# Patient Record
Sex: Female | Born: 1955 | Hispanic: No | Marital: Single | Smoking: Former smoker
Health system: Southern US, Community
[De-identification: ages and names within clinical notes are randomized; demographics above are authoritative.]

## PROBLEM LIST (undated history)

## (undated) DIAGNOSIS — M1990 Unspecified osteoarthritis, unspecified site: Secondary | ICD-10-CM

## (undated) DIAGNOSIS — E785 Hyperlipidemia, unspecified: Secondary | ICD-10-CM

## (undated) HISTORY — PX: TUBAL LIGATION: SHX77

## (undated) HISTORY — DX: Hyperlipidemia, unspecified: E78.5

## (undated) HISTORY — PX: INNER EAR SURGERY: SHX679

## (undated) HISTORY — DX: Unspecified osteoarthritis, unspecified site: M19.90

---

## 2004-12-21 ENCOUNTER — Ambulatory Visit: Payer: Self-pay | Admitting: Family Medicine

## 2005-02-14 ENCOUNTER — Ambulatory Visit: Payer: Self-pay | Admitting: Family Medicine

## 2016-05-09 ENCOUNTER — Ambulatory Visit (INDEPENDENT_AMBULATORY_CARE_PROVIDER_SITE_OTHER): Payer: BLUE CROSS/BLUE SHIELD | Admitting: Family Medicine

## 2016-05-09 ENCOUNTER — Encounter: Payer: Self-pay | Admitting: Family Medicine

## 2016-05-09 VITALS — BP 142/94 | HR 73 | Temp 97.9°F | Ht 64.8 in | Wt 170.5 lb

## 2016-05-09 DIAGNOSIS — Z0000 Encounter for general adult medical examination without abnormal findings: Secondary | ICD-10-CM

## 2016-05-09 DIAGNOSIS — Z1239 Encounter for other screening for malignant neoplasm of breast: Secondary | ICD-10-CM

## 2016-05-09 DIAGNOSIS — Z130 Encounter for screening for diseases of the blood and blood-forming organs and certain disorders involving the immune mechanism: Secondary | ICD-10-CM

## 2016-05-09 DIAGNOSIS — Z13220 Encounter for screening for lipoid disorders: Secondary | ICD-10-CM | POA: Diagnosis not present

## 2016-05-09 DIAGNOSIS — E663 Overweight: Secondary | ICD-10-CM

## 2016-05-09 LAB — CBC
HCT: 40.9 % (ref 36.0–46.0)
Hemoglobin: 13.6 g/dL (ref 12.0–15.0)
MCHC: 33.3 g/dL (ref 30.0–36.0)
MCV: 83.7 fl (ref 78.0–100.0)
Platelets: 279 10*3/uL (ref 150.0–400.0)
RBC: 4.88 Mil/uL (ref 3.87–5.11)
RDW: 13.2 % (ref 11.5–15.5)
WBC: 6.9 10*3/uL (ref 4.0–10.5)

## 2016-05-09 LAB — COMPREHENSIVE METABOLIC PANEL
ALT: 18 U/L (ref 0–35)
AST: 19 U/L (ref 0–37)
Albumin: 4.6 g/dL (ref 3.5–5.2)
Alkaline Phosphatase: 75 U/L (ref 39–117)
BUN: 10 mg/dL (ref 6–23)
CO2: 29 mEq/L (ref 19–32)
Calcium: 9.9 mg/dL (ref 8.4–10.5)
Chloride: 104 mEq/L (ref 96–112)
Creatinine, Ser: 0.81 mg/dL (ref 0.40–1.20)
GFR: 76.24 mL/min (ref >60.00)
Glucose, Bld: 94 mg/dL (ref 70–99)
Potassium: 4.1 mEq/L (ref 3.5–5.1)
Sodium: 141 mEq/L (ref 135–145)
Total Bilirubin: 0.7 mg/dL (ref 0.2–1.2)
Total Protein: 7.2 g/dL (ref 6.0–8.3)

## 2016-05-09 LAB — LIPID PANEL
Cholesterol: 231 mg/dL — ABNORMAL HIGH (ref 0–200)
HDL: 51.4 mg/dL (ref >39.00)
LDL Cholesterol: 144 mg/dL — ABNORMAL HIGH (ref 0–99)
NonHDL: 179.97
Total CHOL/HDL Ratio: 5
Triglycerides: 178 mg/dL — ABNORMAL HIGH (ref 0.0–149.0)
VLDL: 35.6 mg/dL (ref 0.0–40.0)

## 2016-05-09 LAB — HEMOGLOBIN A1C: Hgb A1c MFr Bld: 5.7 % (ref 4.6–6.5)

## 2016-05-09 NOTE — Progress Notes (Signed)
Subjective:  Patient ID: Erika Gonzalez, female    DOB: 03-02-1956  Age: 60 y.o. MRN: [id number]  CC: Establish care   HPI Erika Gonzalez is a 60 y.o. female presents to the clinic today to establish care. Patient has no concerns today.  Preventative Healthcare  Pap smear: In need of. Last pap was in 2006.  Mammogram: In need of. Will schedule.  Colonoscopy: Declines. Discussed cologuard.  Immunizations  Tetanus - In need of but declines.   Pneumococcal - 2017.  Zoster - Declines.  Hepatitis C screening - Declines.  Labs: Screening labs today.  Exercise: Reports regular exercise.  Alcohol use: No.  Smoking/tobacco use: Former smoker.   STD/HIV testing: Declines.  Regular dental exams: Yes.   Wears seat belt: Yes.    PMH, Surgical Hx, Family Hx, Social History reviewed and updated as below.  History reviewed. No pertinent past medical history. Past Surgical History  Procedure Laterality Date  . Inner ear surgery     Family History  Problem Relation Age of Onset  . Heart disease Mother   . Lung cancer Father   . Diabetes Maternal Grandmother    Social History  Substance Use Topics  . Smoking status: Former Research scientist (life sciences)  . Smokeless tobacco: Never Used  . Alcohol Use: No   Review of Systems  Musculoskeletal:       Joint pain - Right hand.  All other systems reviewed and are negative.  Objective:   Today's Vitals: BP 142/94 mmHg  Pulse 73  Temp(Src) 97.9 F (36.6 C) (Oral)  Ht 5' 4.8" (1.646 m)  Wt 170 lb 8 oz (77.338 kg)  BMI 28.55 kg/m2  SpO2 95%  Physical Exam  Constitutional: She is oriented to person, place, and time. She appears well-developed and well-nourished. No distress.  HENT:  Head: Normocephalic and atraumatic.  Nose: Nose normal.  Mouth/Throat: Oropharynx is clear and moist. No oropharyngeal exudate.  Normal TM's bilaterally.   Eyes: Conjunctivae are normal. No scleral icterus.  Neck: Neck supple. No thyromegaly present.    Cardiovascular: Normal rate and regular rhythm.   No murmur heard. Pulmonary/Chest: Effort normal and breath sounds normal. She has no wheezes. She has no rales.  Abdominal: Soft. She exhibits no distension. There is no tenderness. There is no rebound and no guarding.  Musculoskeletal: Normal range of motion. She exhibits no edema.  Lymphadenopathy:    She has no cervical adenopathy.  Neurological: She is alert and oriented to person, place, and time.  Skin: Skin is warm and dry. No rash noted.  Psychiatric: She has a normal mood and affect.  Vitals reviewed.  Assessment & Plan:   Problem List Items Addressed This Visit    Well woman exam (no gynecological exam) - Primary    Will return for Pap smear. Screening labs today. Mammogram scheduled. Declines colonoscopy. Will consider cologuard. Declines immunizations today. Declines hepatitis C and HIV screening. Reports regular dental exams. Former smoker. BP elevated today. Patient to keep a log of blood pressures at home.       Other Visit Diagnoses    Screening for breast cancer        Relevant Orders    MM DIGITAL SCREENING BILATERAL    Screening for deficiency anemia        Relevant Orders    CBC    Overweight (BMI 25.0-29.9)        Relevant Orders    Comp Met (CMET)    HgB A1c  Screening, lipid        Relevant Orders    Lipid Profile       No outpatient encounter prescriptions on file as of 05/09/2016.   No facility-administered encounter medications on file as of 05/09/2016.    Follow-up: Later this year for pap smear & breast exam.  [place]

## 2016-05-09 NOTE — Assessment & Plan Note (Addendum)
Will return for Pap smear. Screening labs today. Mammogram scheduled. Declines colonoscopy. Will consider cologuard. Declines immunizations today. Declines hepatitis C and HIV screening. Reports regular dental exams. Former smoker. BP elevated today. Patient to keep a log of blood pressures at home.

## 2016-05-09 NOTE — Progress Notes (Signed)
Pre visit review using our clinic review tool, if applicable. No additional management support is needed unless otherwise documented below in the visit note. 

## 2016-05-09 NOTE — Patient Instructions (Signed)
It was nice to see you today.  Keep a log of your blood pressures and return them to [place] in 2 weeks.  We will call with your lab results.   Follow up: Later this year for pap smear and breast exam.  Take care  Dr. Lacinda Axon  Health Maintenance, Female Adopting a healthy lifestyle and getting preventive care can go a long way to promote health and wellness. Talk with your health care provider about what schedule of regular examinations is right for you. This is a good chance for you to check in with your provider about disease prevention and staying healthy. In between checkups, there are plenty of things you can do on your own. Experts have done a lot of research about which lifestyle changes and preventive measures are most likely to keep you healthy. Ask your health care provider for more information. WEIGHT AND DIET  Eat a healthy diet  Be sure to include plenty of vegetables, fruits, low-fat dairy products, and lean protein.  Do not eat a lot of foods high in solid fats, added sugars, or salt.  Get regular exercise. This is one of the most important things you can do for your health.  Most adults should exercise for at least 150 minutes each week. The exercise should increase your heart rate and make you sweat (moderate-intensity exercise).  Most adults should also do strengthening exercises at least twice a week. This is in addition to the moderate-intensity exercise.  Maintain a healthy weight  Body mass index (BMI) is a measurement that can be used to identify possible weight problems. It estimates body fat based on height and weight. Your health care provider can help determine your BMI and help you achieve or maintain a healthy weight.  For females 48 years of age and older:   A BMI below 18.5 is considered underweight.  A BMI of 18.5 to 24.9 is normal.  A BMI of 25 to 29.9 is considered overweight.  A BMI of 30 and above is considered obese.  Watch levels of cholesterol  and blood lipids  You should start having your blood tested for lipids and cholesterol at 62 years of age, then have this test every 5 years.  You may need to have your cholesterol levels checked more often if:  Your lipid or cholesterol levels are high.  You are older than 62 years of age.  You are at high risk for heart disease.  CANCER SCREENING   Lung Cancer  Lung cancer screening is recommended for adults 28-52 years old who are at high risk for lung cancer because of a history of smoking.  A yearly low-dose CT scan of the lungs is recommended for people who:  Currently smoke.  Have quit within the past 15 years.  Have at least a 30-pack-year history of smoking. A pack year is smoking an average of one pack of cigarettes a day for 1 year.  Yearly screening should continue until it has been 15 years since you quit.  Yearly screening should stop if you develop a health problem that would prevent you from having lung cancer treatment.  Breast Cancer  Practice breast self-awareness. This means understanding how your breasts normally appear and feel.  It also means doing regular breast self-exams. Let your health care provider know about any changes, no matter how small.  If you are in your 20s or 30s, you should have a clinical breast exam (CBE) by a health care provider every 1-3  years as part of a regular health exam.  If you are 11 or older, have a CBE every year. Also consider having a breast X-ray (mammogram) every year.  If you have a family history of breast cancer, talk to your health care provider about genetic screening.  If you are at high risk for breast cancer, talk to your health care provider about having an MRI and a mammogram every year.  Breast cancer gene (BRCA) assessment is recommended for women who have family members with BRCA-related cancers. BRCA-related cancers include:  Breast.  Ovarian.  Tubal.  Peritoneal cancers.  Results of the  assessment will determine the need for genetic counseling and BRCA1 and BRCA2 testing. Cervical Cancer Your health care provider may recommend that you be screened regularly for cancer of the pelvic organs (ovaries, uterus, and vagina). This screening involves a pelvic examination, including checking for microscopic changes to the surface of your cervix (Pap test). You may be encouraged to have this screening done every 3 years, beginning at age 67.  For women ages 23-65, health care providers may recommend pelvic exams and Pap testing every 3 years, or they may recommend the Pap and pelvic exam, combined with testing for human papilloma virus (HPV), every 5 years. Some types of HPV increase your risk of cervical cancer. Testing for HPV may also be done on women of any age with unclear Pap test results.  Other health care providers may not recommend any screening for nonpregnant women who are considered low risk for pelvic cancer and who do not have symptoms. Ask your health care provider if a screening pelvic exam is right for you.  If you have had past treatment for cervical cancer or a condition that could lead to cancer, you need Pap tests and screening for cancer for at least 20 years after your treatment. If Pap tests have been discontinued, your risk factors (such as having a new sexual partner) need to be reassessed to determine if screening should resume. Some women have medical problems that increase the chance of getting cervical cancer. In these cases, your health care provider may recommend more frequent screening and Pap tests. Colorectal Cancer  This type of cancer can be detected and often prevented.  Routine colorectal cancer screening usually begins at 62 years of age and continues through 62 years of age.  Your health care provider may recommend screening at an earlier age if you have risk factors for colon cancer.  Your health care provider may also recommend using home test kits  to check for hidden blood in the stool.  A small camera at the end of a tube can be used to examine your colon directly (sigmoidoscopy or colonoscopy). This is done to check for the earliest forms of colorectal cancer.  Routine screening usually begins at age 35.  Direct examination of the colon should be repeated every 5-10 years through 62 years of age. However, you may need to be screened more often if early forms of precancerous polyps or small growths are found. Skin Cancer  Check your skin from head to toe regularly.  Tell your health care provider about any new moles or changes in moles, especially if there is a change in a mole's shape or color.  Also tell your health care provider if you have a mole that is larger than the size of a pencil eraser.  Always use sunscreen. Apply sunscreen liberally and repeatedly throughout the day.  Protect yourself by wearing long  sleeves, pants, a wide-brimmed hat, and sunglasses whenever you are outside. HEART DISEASE, DIABETES, AND HIGH BLOOD PRESSURE   High blood pressure causes heart disease and increases the risk of stroke. High blood pressure is more likely to develop in:  People who have blood pressure in the high end of the normal range (130-139/85-89 mm Hg).  People who are overweight or obese.  People who are African American.  If you are 44-92 years of age, have your blood pressure checked every 3-5 years. If you are 33 years of age or older, have your blood pressure checked every year. You should have your blood pressure measured twice--once when you are at a hospital or clinic, and once when you are not at a hospital or clinic. Record the average of the two measurements. To check your blood pressure when you are not at a hospital or clinic, you can use:  An automated blood pressure machine at a pharmacy.  A home blood pressure monitor.  If you are between 73 years and 62 years old, ask your health care provider if you should  take aspirin to prevent strokes.  Have regular diabetes screenings. This involves taking a blood sample to check your fasting blood sugar level.  If you are at a normal weight and have a low risk for diabetes, have this test once every three years after 62 years of age.  If you are overweight and have a high risk for diabetes, consider being tested at a younger age or more often. PREVENTING INFECTION  Hepatitis B  If you have a higher risk for hepatitis B, you should be screened for this virus. You are considered at high risk for hepatitis B if:  You were born in a country where hepatitis B is common. Ask your health care provider which countries are considered high risk.  Your parents were born in a high-risk country, and you have not been immunized against hepatitis B (hepatitis B vaccine).  You have HIV or AIDS.  You use needles to inject street drugs.  You live with someone who has hepatitis B.  You have had sex with someone who has hepatitis B.  You get hemodialysis treatment.  You take certain medicines for conditions, including cancer, organ transplantation, and autoimmune conditions. Hepatitis C  Blood testing is recommended for:  Everyone born from [birth year] through 1967.  Anyone with known risk factors for hepatitis C. Sexually transmitted infections (STIs)  You should be screened for sexually transmitted infections (STIs) including gonorrhea and chlamydia if:  You are sexually active and are younger than 62 years of age.  You are older than 62 years of age and your health care provider tells you that you are at risk for this type of infection.  Your sexual activity has changed since you were last screened and you are at an increased risk for chlamydia or gonorrhea. Ask your health care provider if you are at risk.  If you do not have HIV, but are at risk, it may be recommended that you take a prescription medicine daily to prevent HIV infection. This is called  pre-exposure prophylaxis (PrEP). You are considered at risk if:  You are sexually active and do not regularly use condoms or know the HIV status of your partner(s).  You take drugs by injection.  You are sexually active with a partner who has HIV. Talk with your health care provider about whether you are at high risk of being infected with HIV. If you choose  to begin PrEP, you should first be tested for HIV. You should then be tested every 3 months for as long as you are taking PrEP.  PREGNANCY   If you are premenopausal and you may become pregnant, ask your health care provider about preconception counseling.  If you may become pregnant, take 400 to 800 micrograms (mcg) of folic acid every day.  If you want to prevent pregnancy, talk to your health care provider about birth control (contraception). OSTEOPOROSIS AND MENOPAUSE   Osteoporosis is a disease in which the bones lose minerals and strength with aging. This can result in serious bone fractures. Your risk for osteoporosis can be identified using a bone density scan.  If you are 61 years of age or older, or if you are at risk for osteoporosis and fractures, ask your health care provider if you should be screened.  Ask your health care provider whether you should take a calcium or vitamin D supplement to lower your risk for osteoporosis.  Menopause may have certain physical symptoms and risks.  Hormone replacement therapy may reduce some of these symptoms and risks. Talk to your health care provider about whether hormone replacement therapy is right for you.  HOME CARE INSTRUCTIONS   Schedule regular health, dental, and eye exams.  Stay current with your immunizations.   Do not use any tobacco products including cigarettes, chewing tobacco, or electronic cigarettes.  If you are pregnant, do not drink alcohol.  If you are breastfeeding, limit how much and how often you drink alcohol.  Limit alcohol intake to no more than 1  drink per day for nonpregnant women. One drink equals 12 ounces of beer, 5 ounces of wine, or 1 ounces of hard liquor.  Do not use street drugs.  Do not share needles.  Ask your health care provider for help if you need support or information about quitting drugs.  Tell your health care provider if you often feel depressed.  Tell your health care provider if you have ever been abused or do not feel safe at home.   This information is not intended to replace advice given to you by your health care provider. Make sure you discuss any questions you have with your health care provider.   Document Released: 05/06/2011 Document Revised: 11/11/2014 Document Reviewed: 09/22/2013 Elsevier Interactive Patient Education [employer].

## 2016-05-23 ENCOUNTER — Other Ambulatory Visit: Payer: Self-pay | Admitting: Family Medicine

## 2016-05-23 ENCOUNTER — Ambulatory Visit
Admission: RE | Admit: 2016-05-23 | Discharge: 2016-05-23 | Disposition: A | Discharge status: Home / Self Care | Payer: BLUE CROSS/BLUE SHIELD | Source: Ambulatory Visit | Attending: Family Medicine | Admitting: Family Medicine

## 2016-05-23 DIAGNOSIS — R928 Other abnormal and inconclusive findings on diagnostic imaging of breast: Secondary | ICD-10-CM | POA: Insufficient documentation

## 2016-05-23 DIAGNOSIS — Z1239 Encounter for other screening for malignant neoplasm of breast: Secondary | ICD-10-CM

## 2016-05-23 DIAGNOSIS — Z1231 Encounter for screening mammogram for malignant neoplasm of breast: Secondary | ICD-10-CM | POA: Diagnosis not present

## 2016-05-28 ENCOUNTER — Other Ambulatory Visit: Payer: Self-pay | Admitting: Family Medicine

## 2016-05-28 DIAGNOSIS — N6310 Unspecified lump in the right breast, unspecified quadrant: Secondary | ICD-10-CM

## 2016-06-05 ENCOUNTER — Ambulatory Visit
Admission: RE | Admit: 2016-06-05 | Discharge: 2016-06-05 | Disposition: A | Discharge status: Home / Self Care | Payer: BLUE CROSS/BLUE SHIELD | Source: Ambulatory Visit | Attending: Family Medicine | Admitting: Family Medicine

## 2016-06-05 DIAGNOSIS — N63 Unspecified lump in breast: Secondary | ICD-10-CM | POA: Diagnosis not present

## 2016-06-05 DIAGNOSIS — R922 Inconclusive mammogram: Secondary | ICD-10-CM | POA: Diagnosis not present

## 2016-06-05 DIAGNOSIS — N6310 Unspecified lump in the right breast, unspecified quadrant: Secondary | ICD-10-CM

## 2016-06-21 ENCOUNTER — Telehealth: Payer: Self-pay | Admitting: Family Medicine

## 2016-06-21 NOTE — Telephone Encounter (Signed)
Pt would like to change pcp from Dr Adriana Simasook to Mayra ReelKate Clark. She is unhappy with the care she has gotten at [facility].  please let me know it this is ok with you.   cb number is [phone] .

## 2016-06-22 NOTE — Telephone Encounter (Signed)
Ok with me 

## 2016-06-23 NOTE — Telephone Encounter (Signed)
Ok with me as well  

## 2016-06-25 NOTE — Telephone Encounter (Signed)
Pt scheduled for 08/31 pt aware

## 2016-06-25 NOTE — Telephone Encounter (Signed)
Left message for pt to call back  °

## 2016-07-04 ENCOUNTER — Ambulatory Visit (INDEPENDENT_AMBULATORY_CARE_PROVIDER_SITE_OTHER): Payer: BLUE CROSS/BLUE SHIELD | Admitting: Primary Care

## 2016-07-04 ENCOUNTER — Other Ambulatory Visit (HOSPITAL_COMMUNITY)
Admission: RE | Admit: 2016-07-04 | Discharge: 2016-07-04 | Disposition: A | Discharge status: Home / Self Care | Payer: BLUE CROSS/BLUE SHIELD | Source: Ambulatory Visit | Attending: Primary Care | Admitting: Primary Care

## 2016-07-04 ENCOUNTER — Encounter: Payer: Self-pay | Admitting: Primary Care

## 2016-07-04 VITALS — BP 124/84 | HR 83 | Temp 97.8°F | Ht 65.0 in | Wt 172.4 lb

## 2016-07-04 DIAGNOSIS — Z1151 Encounter for screening for human papillomavirus (HPV): Secondary | ICD-10-CM | POA: Insufficient documentation

## 2016-07-04 DIAGNOSIS — Z124 Encounter for screening for malignant neoplasm of cervix: Secondary | ICD-10-CM

## 2016-07-04 DIAGNOSIS — Z01419 Encounter for gynecological examination (general) (routine) without abnormal findings: Secondary | ICD-10-CM | POA: Diagnosis not present

## 2016-07-04 DIAGNOSIS — E785 Hyperlipidemia, unspecified: Secondary | ICD-10-CM

## 2016-07-04 NOTE — Patient Instructions (Addendum)
We will notify you of your pap results once received.  Continue to work on improvements in your diet. Continue regular exercise.  We will need to monitor your cholesterol levels. Schedule a lab only appointment for January 2018 to recheck your cholesterol. Ensure you are fasting 4 hours prior to this appointment. You may have water and black coffee.  Follow up in July 2018 for your annual physical exam.  It was a pleasure to meet you today! Please don't hesitate to call me with any questions. [facility]!   High Cholesterol High cholesterol refers to having a high level of cholesterol in your blood. Cholesterol is a white, waxy, fat-like protein that your body needs in small amounts. Your liver makes all the cholesterol you need. Excess cholesterol comes from the food you eat. Cholesterol travels in your bloodstream through your blood vessels. If you have high cholesterol, deposits (plaque) may build up on the walls of your blood vessels. This makes the arteries narrower and stiffer. Plaque increases your risk of heart attack and stroke. Work with your health care provider to keep your cholesterol levels in a healthy range. RISK FACTORS Several things can make you more likely to have high cholesterol. These include:   Eating foods high in animal fat (saturated fat) or cholesterol.  Being overweight.  Not getting enough exercise.  Having a family history of high cholesterol. SIGNS AND SYMPTOMS High cholesterol does not cause symptoms. DIAGNOSIS  Your health care provider can do a blood test to check whether you have high cholesterol. If you are older than 20, your health care provider may check your cholesterol every 4-6 years. You may be checked more often if you already have high cholesterol or other risk factors for heart disease. The blood test for cholesterol measures the following:  Bad cholesterol (LDL cholesterol). This is the type of cholesterol that  causes heart disease. This number should be less than 100.  Good cholesterol (HDL cholesterol). This type helps protect against heart disease. A healthy level of HDL cholesterol is 60 or higher.  Total cholesterol. This is the combined number of LDL cholesterol and HDL cholesterol. A healthy number is less than 200. TREATMENT  High cholesterol can be treated with diet changes, lifestyle changes, and medicine.   Diet changes may include eating more whole grains, fruits, vegetables, nuts, and fish. You may also have to cut back on red meat and foods with a lot of added sugar.  Lifestyle changes may include getting at least 40 minutes of aerobic exercise three times a week. Aerobic exercises include walking, biking, and swimming. Aerobic exercise along with a healthy diet can help you maintain a healthy weight. Lifestyle changes may also include quitting smoking.  If diet and lifestyle changes are not enough to lower your cholesterol, your health care provider may prescribe a statin medicine. This medicine has been shown to lower cholesterol and also lower the risk of heart disease. HOME CARE INSTRUCTIONS  Only take over-the-counter or prescription medicines as directed by your health care provider.   Follow a healthy diet as directed by your health care provider. For instance:   Eat chicken (without skin), fish, veal, shellfish, ground [place]key breast, and round or loin cuts of red meat.  Do not eat fried foods and fatty meats, such as hot dogs and salami.   Eat plenty of fruits, such as apples.   Eat plenty of vegetables, such as broccoli, potatoes, and carrots.   Eat  beans, peas, and lentils.   Eat grains, such as barley, rice, couscous, and bulgur wheat.   Eat pasta without cream sauces.   Use skim or nonfat milk and low-fat or nonfat yogurt and cheeses. Do not eat or drink whole milk, cream, ice cream, egg yolks, and hard cheeses.   Do not eat stick margarine or tub  margarines that contain trans fats (also called partially hydrogenated oils).   Do not eat cakes, cookies, crackers, or other baked goods that contain trans fats.   Do not eat saturated tropical oils, such as coconut and palm oil.   Exercise as directed by your health care provider. Increase your activity level with activities such as gardening or walking.   Keep all follow-up appointments.  SEEK MEDICAL CARE IF:  You are struggling to maintain a healthy diet or weight.  You need help starting an exercise program.  You need help to stop smoking. SEEK IMMEDIATE MEDICAL CARE IF:  You have chest pain.  You have trouble breathing.   This information is not intended to replace advice given to you by your health care provider. Make sure you discuss any questions you have with your health care provider.   Document Released: 10/21/2005 Document Revised: 11/11/2014 Document Reviewed: 08/13/2013 Elsevier Interactive Patient Education [employer]

## 2016-07-04 NOTE — Assessment & Plan Note (Signed)
Pap with HPV completed today. Pelvic exam unremarkable. Repeat in 3 years if normal.

## 2016-07-04 NOTE — Assessment & Plan Note (Signed)
Total cholesterol and LDL all above goal slightly in July 2017. She is working on improvements in diet and regular exercise. Will repeat lipids in January 2018.

## 2016-07-04 NOTE — Progress Notes (Signed)
   Subjective:    Patient ID: Erika Gonzalez, female    DOB: 11/03/1956, 60 y.o.   MRN: [id number]  HPI  [name] is a 60 year old female who [facility] She completed a well woman exam in early July 2017 with prior PCP. The exam was unremarkable. Labs with slight elevation in cholesterol. She is working on improvements in her diet and has been walking several days weekly. She is here today for a Pap. Her last pap was in 2007 which was normal. She is postmenopausal since 2005. No vaginal bleeding, unexplained weight loss. She has no other questions/concerns today.  Review of Systems  Respiratory: Negative for shortness of breath.   Cardiovascular: Negative for chest pain.  Genitourinary: Negative for vaginal bleeding and vaginal discharge.  Neurological: Negative for dizziness.       Past Medical History:  Diagnosis Date  . Hyperlipidemia      Social History   Social History  . Marital status: Married    Spouse name: N/A  . Number of children: N/A  . Years of education: N/A   Occupational History  . Not on file.   Social History Main Topics  . Smoking status: Former Games developermoker  . Smokeless tobacco: Never Used  . Alcohol use No  . Drug use: No  . Sexual activity: Not on file   Other Topics Concern  . Not on file   Social History Narrative   Divorced.   2 children, 5 grandchildren.   Works as an Government social research officeroffice assistant.   Enjoys reading, walking, spending time with grandchildren.    Past Surgical History:  Procedure Laterality Date  . INNER EAR SURGERY    . TUBAL LIGATION      Family History  Problem Relation Age of Onset  . Heart disease Mother   . Diabetes Mother   . Lung cancer Father   . Diabetes Maternal Grandmother   . Breast cancer Neg Hx     No Known Allergies  No current outpatient prescriptions on file prior to visit.   No current facility-administered medications on file prior to visit.     BP 124/84    Pulse 83   Temp 97.8 F (36.6 C) (Oral)   Ht 5\' 5"  (1.651 m)   Wt 172 lb 6.4 oz (78.2 kg)   SpO2 97%   BMI 28.69 kg/m    Objective:   Physical Exam  Constitutional: She appears well-nourished.  Cardiovascular: Normal rate and regular rhythm.   Pulmonary/Chest: Effort normal and breath sounds normal.  Genitourinary: There is no tenderness on the right labia. There is no tenderness on the left labia. Cervix exhibits no motion tenderness and no discharge. Right adnexum displays no tenderness. Left adnexum displays no tenderness. No erythema in the vagina. No vaginal discharge found.  Skin: Skin is warm and dry.          Assessment & Plan:

## 2016-07-04 NOTE — Progress Notes (Signed)
Pre visit review using our clinic review tool, if applicable. No additional management support is needed unless otherwise documented below in the visit note. 

## 2016-07-05 LAB — CYTOLOGY - PAP

## 2016-07-09 ENCOUNTER — Encounter: Payer: Self-pay | Admitting: *Deleted

## 2016-11-07 ENCOUNTER — Other Ambulatory Visit: Payer: Self-pay

## 2017-08-05 ENCOUNTER — Other Ambulatory Visit: Payer: Self-pay | Admitting: Family Medicine

## 2017-08-11 ENCOUNTER — Other Ambulatory Visit (HOSPITAL_COMMUNITY)
Admission: RE | Admit: 2017-08-11 | Discharge: 2017-08-11 | Disposition: A | Discharge status: Home / Self Care | Payer: BLUE CROSS/BLUE SHIELD | Source: Ambulatory Visit | Attending: Primary Care | Admitting: Primary Care

## 2017-08-11 ENCOUNTER — Ambulatory Visit (INDEPENDENT_AMBULATORY_CARE_PROVIDER_SITE_OTHER): Payer: BLUE CROSS/BLUE SHIELD | Admitting: Primary Care

## 2017-08-11 ENCOUNTER — Encounter: Payer: Self-pay | Admitting: Primary Care

## 2017-08-11 VITALS — BP 124/82 | HR 73 | Temp 98.2°F | Ht 65.0 in | Wt 170.1 lb

## 2017-08-11 DIAGNOSIS — Z124 Encounter for screening for malignant neoplasm of cervix: Secondary | ICD-10-CM | POA: Insufficient documentation

## 2017-08-11 DIAGNOSIS — E785 Hyperlipidemia, unspecified: Secondary | ICD-10-CM

## 2017-08-11 DIAGNOSIS — R7303 Prediabetes: Secondary | ICD-10-CM | POA: Diagnosis not present

## 2017-08-11 DIAGNOSIS — Z1231 Encounter for screening mammogram for malignant neoplasm of breast: Secondary | ICD-10-CM | POA: Diagnosis not present

## 2017-08-11 DIAGNOSIS — Z1239 Encounter for other screening for malignant neoplasm of breast: Secondary | ICD-10-CM

## 2017-08-11 LAB — COMPREHENSIVE METABOLIC PANEL
ALT: 20 U/L (ref 0–35)
AST: 18 U/L (ref 0–37)
Albumin: 4.5 g/dL (ref 3.5–5.2)
Alkaline Phosphatase: 74 U/L (ref 39–117)
BUN: 9 mg/dL (ref 6–23)
CO2: 30 mEq/L (ref 19–32)
Calcium: 10 mg/dL (ref 8.4–10.5)
Chloride: 104 mEq/L (ref 96–112)
Creatinine, Ser: 0.83 mg/dL (ref 0.40–1.20)
GFR: 73.82 mL/min (ref >60.00)
Glucose, Bld: 91 mg/dL (ref 70–99)
Potassium: 4.8 mEq/L (ref 3.5–5.1)
Sodium: 141 mEq/L (ref 135–145)
Total Bilirubin: 0.7 mg/dL (ref 0.2–1.2)
Total Protein: 7.6 g/dL (ref 6.0–8.3)

## 2017-08-11 LAB — LIPID PANEL
Cholesterol: 231 mg/dL — ABNORMAL HIGH (ref 0–200)
HDL: 51.6 mg/dL (ref >39.00)
LDL Cholesterol: 142 mg/dL — ABNORMAL HIGH (ref 0–99)
NonHDL: 179.8
Total CHOL/HDL Ratio: 4
Triglycerides: 189 mg/dL — ABNORMAL HIGH (ref 0.0–149.0)
VLDL: 37.8 mg/dL (ref 0.0–40.0)

## 2017-08-11 LAB — HEMOGLOBIN A1C: Hgb A1c MFr Bld: 5.8 % (ref 4.6–6.5)

## 2017-08-11 NOTE — Assessment & Plan Note (Signed)
Repeat A1C today. Discussed the importance of a healthy diet and regular exercise in order for weight loss, and to reduce the risk of other medical problems.  

## 2017-08-11 NOTE — Patient Instructions (Addendum)
Call the [facility] to schedule your mammogram.   Complete lab work prior to leaving today. I will notify you of your results once received.   We will notify you of the Pap results and Cologuard results once received.  It was a pleasure to see you today!

## 2017-08-11 NOTE — Assessment & Plan Note (Signed)
Repeat lipids pending. Discussed the importance of a healthy diet and regular exercise in order for weight loss, and to reduce the risk of other medical problems.

## 2017-08-11 NOTE — Assessment & Plan Note (Addendum)
Would like pap smear despite normal result in 2017.  Pap smear completed. Also signed up for Cologuard for colon cancer screening.

## 2017-08-11 NOTE — Addendum Note (Signed)
Addended by: Tawnya Crook on: 08/11/2017 12:34 PM   Modules accepted: Orders

## 2017-08-11 NOTE — Progress Notes (Signed)
   Subjective:    Patient ID: Erika Gonzalez, female    DOB: February 01, 1956, 61 y.o.   MRN: [id number]  HPI  [name] is a 61 year old female who presents today for pap smear only. She would also like to schedule a mammogram. She has never undergone colon cancer screening and is interested in Cologuard. She declines Shingles vaccination. She gets her influenza vaccination at work.   She has no other complaints.   Review of Systems  Constitutional: Negative for fatigue.  Respiratory: Negative for shortness of breath.   Cardiovascular: Negative for chest pain.  Gastrointestinal: Negative for blood in stool.  Genitourinary: Negative for difficulty urinating, dysuria and vaginal bleeding.       Past Medical History:  Diagnosis Date  . Hyperlipidemia      Social History   Social History  . Marital status: Married    Spouse name: N/A  . Number of children: N/A  . Years of education: N/A   Occupational History  . Not on file.   Social History Main Topics  . Smoking status: Former Games developer  . Smokeless tobacco: Never Used  . Alcohol use No  . Drug use: No  . Sexual activity: Not on file   Other Topics Concern  . Not on file   Social History Narrative   Divorced.   2 children, 5 grandchildren.   Works as an Government social research officer.   Enjoys reading, walking, spending time with grandchildren.    Past Surgical History:  Procedure Laterality Date  . INNER EAR SURGERY    . TUBAL LIGATION      Family History  Problem Relation Age of Onset  . Heart disease Mother   . Diabetes Mother   . Lung cancer Father   . Diabetes Maternal Grandmother   . Breast cancer Neg Hx     No Known Allergies  No current outpatient prescriptions on file prior to visit.   No current facility-administered medications on file prior to visit.     BP 124/82   Pulse 73   Temp 98.2 F (36.8 C) (Oral)   Ht  (1.651 m)   Wt 170 lb 1.9 oz (77.2 kg)   SpO2 99%   BMI 28.31 kg/m     Objective:   Physical Exam  Constitutional: She appears well-nourished.  Neck: Neck supple.  Cardiovascular: Normal rate and regular rhythm.   Pulmonary/Chest: Effort normal and breath sounds normal.  Skin: Skin is warm and dry.  Psychiatric: She has a normal mood and affect.          Assessment & Plan:

## 2017-08-13 LAB — CYTOLOGY - PAP
Diagnosis: NEGATIVE
HPV: NOT DETECTED

## 2017-08-14 ENCOUNTER — Encounter: Payer: Self-pay | Admitting: *Deleted

## 2017-08-14 ENCOUNTER — Telehealth: Payer: Self-pay | Admitting: Primary Care

## 2017-08-14 NOTE — Telephone Encounter (Signed)
Message left for patient to return my call.  

## 2017-08-14 NOTE — Telephone Encounter (Signed)
Best number [phone] Pt returned your call

## 2017-09-02 ENCOUNTER — Ambulatory Visit
Admission: RE | Admit: 2017-09-02 | Discharge: 2017-09-02 | Disposition: A | Discharge status: Home / Self Care | Payer: BLUE CROSS/BLUE SHIELD | Source: Ambulatory Visit | Attending: Primary Care | Admitting: Primary Care

## 2017-09-02 DIAGNOSIS — Z1231 Encounter for screening mammogram for malignant neoplasm of breast: Secondary | ICD-10-CM | POA: Insufficient documentation

## 2017-09-02 DIAGNOSIS — Z1239 Encounter for other screening for malignant neoplasm of breast: Secondary | ICD-10-CM

## 2018-05-11 IMAGING — MG MM DIGITAL SCREENING BILAT W/ TOMO W/ CAD
8 of 13 series · 8 of 29 positions shown · non-contrast
Comparison: Previous exam(s).

CLINICAL DATA: Screening.

EXAM:
2D DIGITAL SCREENING BILATERAL MAMMOGRAM WITH CAD AND ADJUNCT TOMO

[L MLO (1 of 2)]
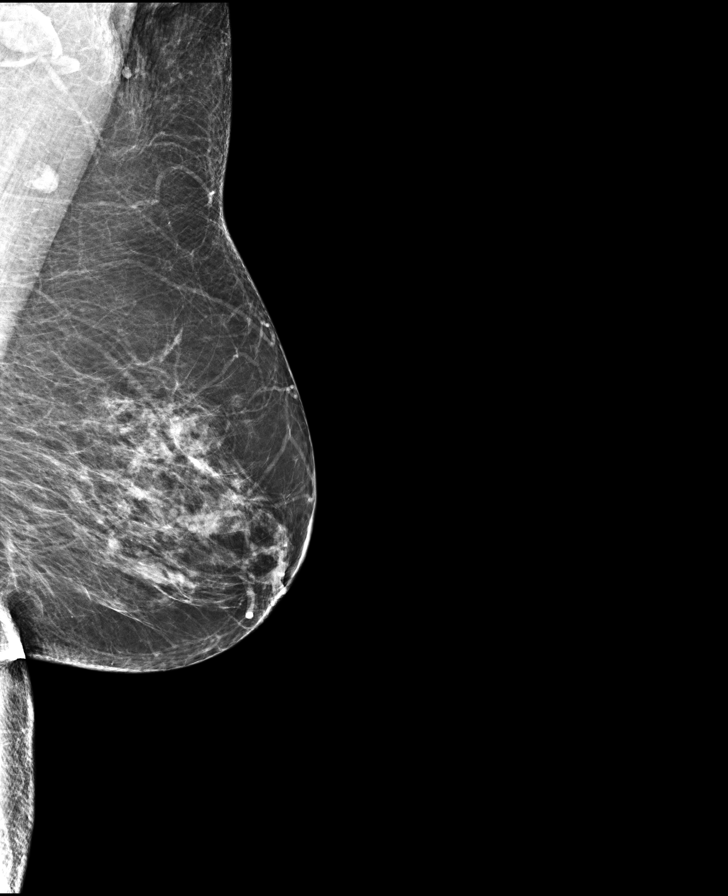

[L MLO synth-2D]
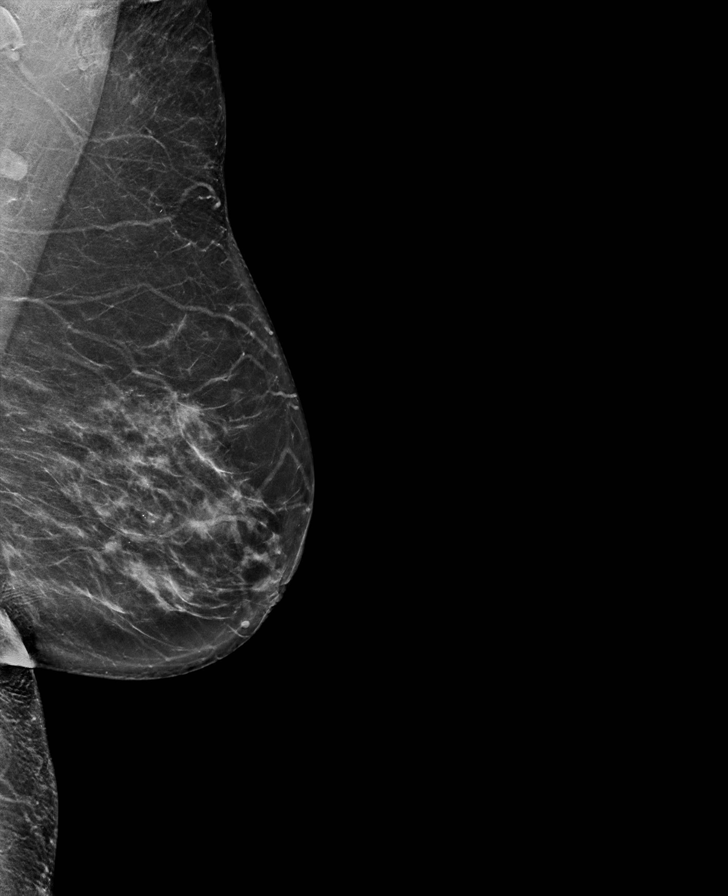

[R CC synth-2D]
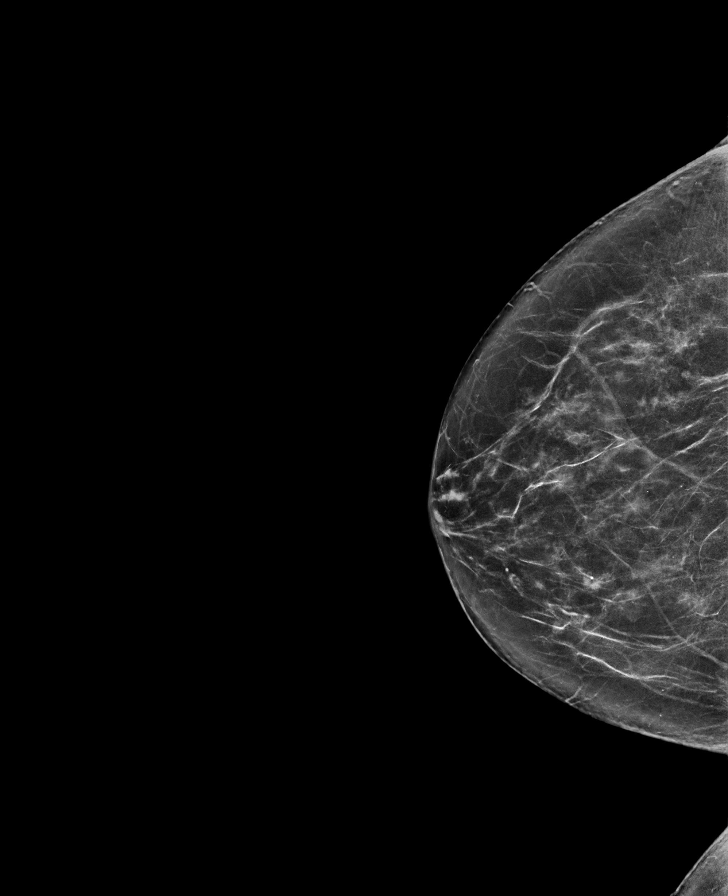

[L CC]
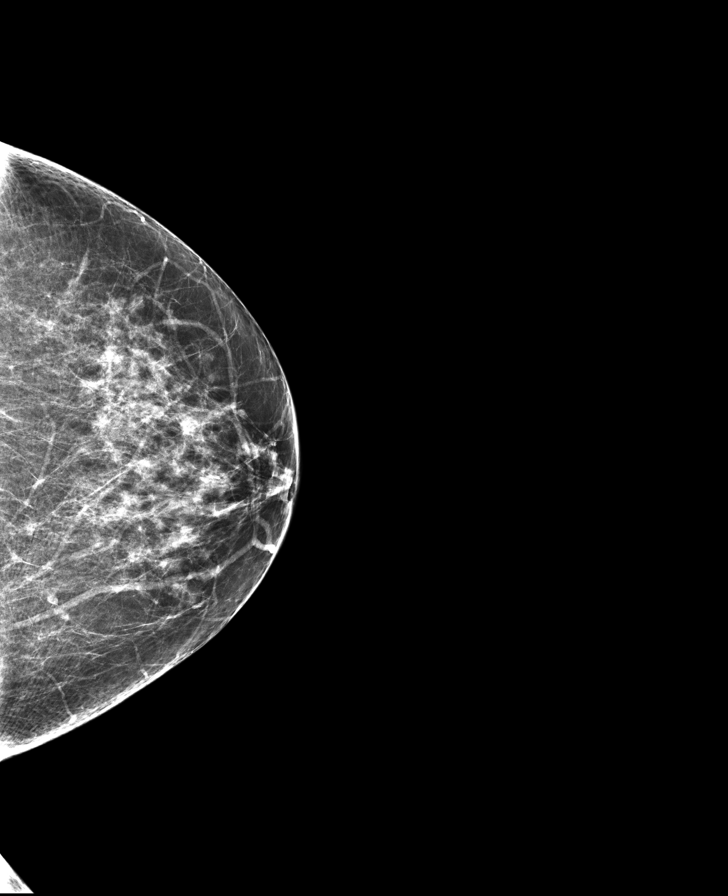

[R MLO]
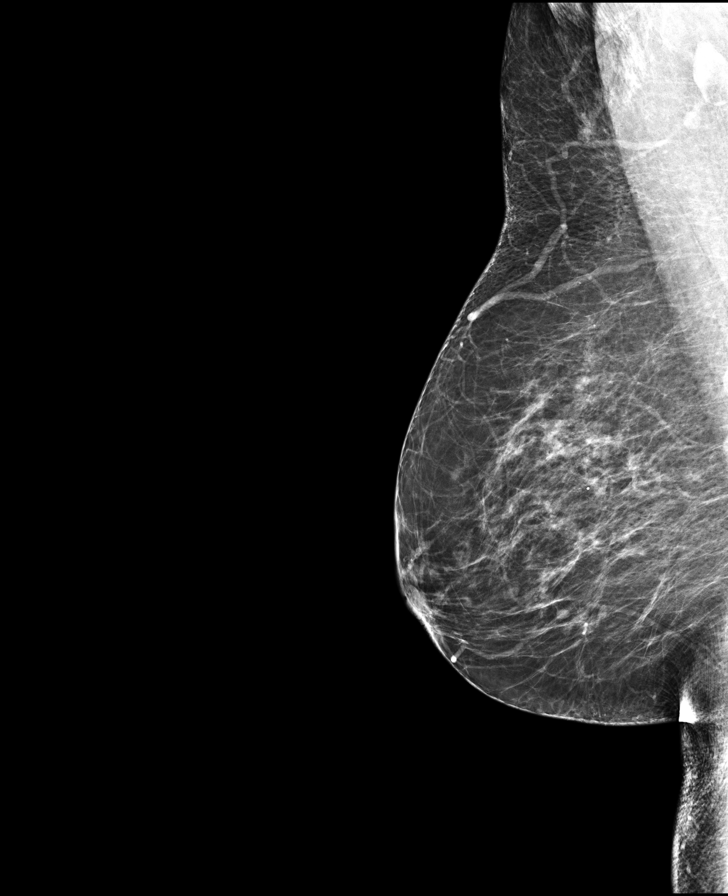

[L MLO (2 of 2)]
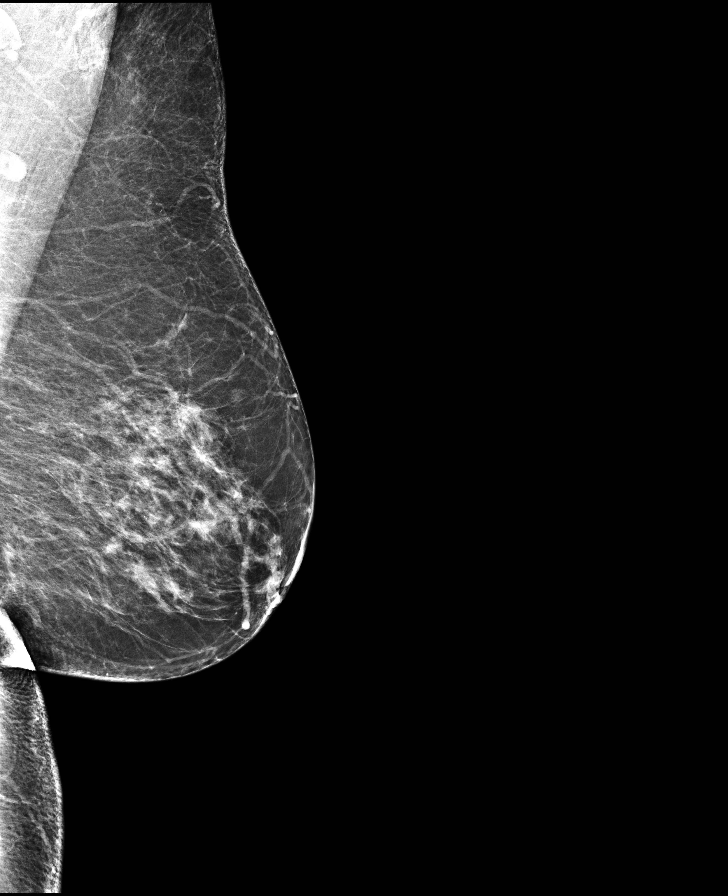

[R CC]
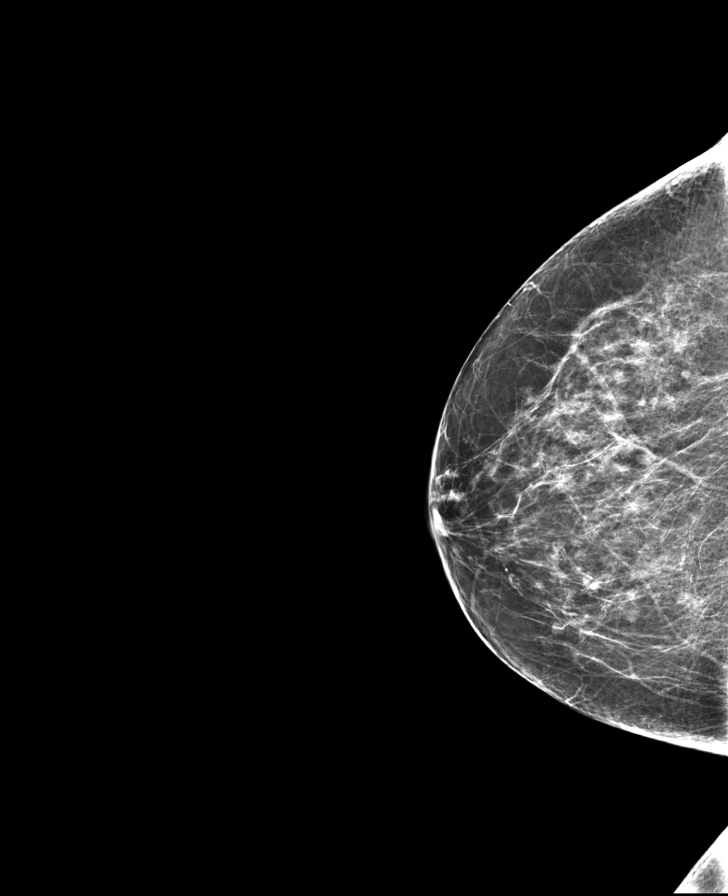

[R MLO synth-2D]
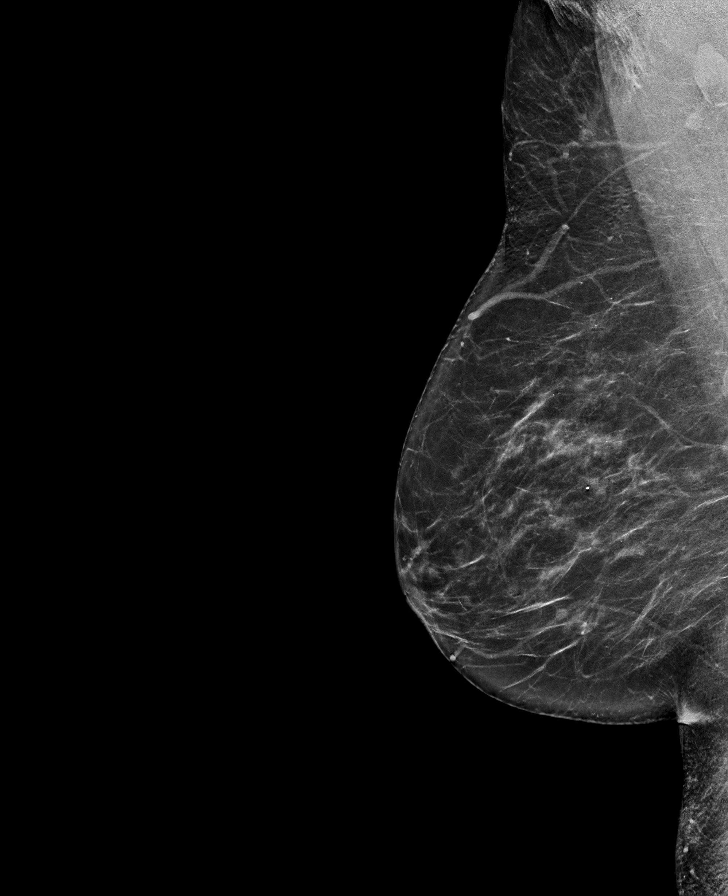

[8 of 29 positions shown; findings below may reference images not displayed]

ACR Breast Density Category c: The breast tissue is heterogeneously
dense, which may obscure small masses.
FINDINGS: There are no findings suspicious for malignancy. Images were
processed with CAD.
IMPRESSION: No mammographic evidence of malignancy. A result letter of this
screening mammogram will be mailed directly to the patient.

RECOMMENDATION:
Screening mammogram in one year. (Code:[id number])

BI-RADS CATEGORY  1: Negative.

## 2020-05-31 ENCOUNTER — Other Ambulatory Visit: Payer: Self-pay | Admitting: Primary Care

## 2020-05-31 DIAGNOSIS — Z114 Encounter for screening for human immunodeficiency virus [HIV]: Secondary | ICD-10-CM

## 2020-05-31 DIAGNOSIS — Z1159 Encounter for screening for other viral diseases: Secondary | ICD-10-CM

## 2020-05-31 DIAGNOSIS — R7303 Prediabetes: Secondary | ICD-10-CM

## 2020-05-31 DIAGNOSIS — E785 Hyperlipidemia, unspecified: Secondary | ICD-10-CM

## 2020-06-28 ENCOUNTER — Other Ambulatory Visit: Payer: Self-pay

## 2020-06-28 ENCOUNTER — Other Ambulatory Visit (INDEPENDENT_AMBULATORY_CARE_PROVIDER_SITE_OTHER): Payer: BC Managed Care – PPO

## 2020-06-28 DIAGNOSIS — Z1159 Encounter for screening for other viral diseases: Secondary | ICD-10-CM

## 2020-06-28 DIAGNOSIS — E785 Hyperlipidemia, unspecified: Secondary | ICD-10-CM

## 2020-06-28 DIAGNOSIS — R7303 Prediabetes: Secondary | ICD-10-CM

## 2020-06-28 DIAGNOSIS — Z114 Encounter for screening for human immunodeficiency virus [HIV]: Secondary | ICD-10-CM

## 2020-06-28 LAB — COMPREHENSIVE METABOLIC PANEL
ALT: 18 U/L (ref 0–35)
AST: 19 U/L (ref 0–37)
Albumin: 4.5 g/dL (ref 3.5–5.2)
Alkaline Phosphatase: 73 U/L (ref 39–117)
BUN: 9 mg/dL (ref 6–23)
CO2: 30 mEq/L (ref 19–32)
Calcium: 9.7 mg/dL (ref 8.4–10.5)
Chloride: 103 mEq/L (ref 96–112)
Creatinine, Ser: 0.87 mg/dL (ref 0.40–1.20)
GFR: 65.19 mL/min (ref >60.00)
Glucose, Bld: 101 mg/dL — ABNORMAL HIGH (ref 70–99)
Potassium: 4.4 mEq/L (ref 3.5–5.1)
Sodium: 141 mEq/L (ref 135–145)
Total Bilirubin: 0.6 mg/dL (ref 0.2–1.2)
Total Protein: 7.7 g/dL (ref 6.0–8.3)

## 2020-06-28 LAB — CBC
HCT: 41.7 % (ref 36.0–46.0)
Hemoglobin: 13.9 g/dL (ref 12.0–15.0)
MCHC: 33.4 g/dL (ref 30.0–36.0)
MCV: 85.3 fl (ref 78.0–100.0)
Platelets: 304 10*3/uL (ref 150.0–400.0)
RBC: 4.89 Mil/uL (ref 3.87–5.11)
RDW: 13.2 % (ref 11.5–15.5)
WBC: 7 10*3/uL (ref 4.0–10.5)

## 2020-06-28 LAB — LIPID PANEL
Cholesterol: 231 mg/dL — ABNORMAL HIGH (ref 0–200)
HDL: 49.1 mg/dL (ref >39.00)
NonHDL: 181.55
Total CHOL/HDL Ratio: 5
Triglycerides: 222 mg/dL — ABNORMAL HIGH (ref 0.0–149.0)
VLDL: 44.4 mg/dL — ABNORMAL HIGH (ref 0.0–40.0)

## 2020-06-28 LAB — HEMOGLOBIN A1C: Hgb A1c MFr Bld: 5.8 % (ref 4.6–6.5)

## 2020-06-28 LAB — LDL CHOLESTEROL, DIRECT: Direct LDL: 147 mg/dL

## 2020-06-29 LAB — HIV ANTIBODY (ROUTINE TESTING W REFLEX): HIV 1&2 Ab, 4th Generation: NONREACTIVE

## 2020-06-29 LAB — HEPATITIS C ANTIBODY
Hepatitis C Ab: NONREACTIVE
SIGNAL TO CUT-OFF: 0.02 (ref <1.00)

## 2020-07-05 ENCOUNTER — Other Ambulatory Visit: Payer: Self-pay

## 2020-07-05 ENCOUNTER — Encounter: Payer: Self-pay | Admitting: Primary Care

## 2020-07-05 ENCOUNTER — Ambulatory Visit (INDEPENDENT_AMBULATORY_CARE_PROVIDER_SITE_OTHER): Payer: BC Managed Care – PPO | Admitting: Primary Care

## 2020-07-05 VITALS — BP 144/80 | HR 78 | Ht 64.5 in | Wt 171.0 lb

## 2020-07-05 DIAGNOSIS — Z1231 Encounter for screening mammogram for malignant neoplasm of breast: Secondary | ICD-10-CM

## 2020-07-05 DIAGNOSIS — Z1211 Encounter for screening for malignant neoplasm of colon: Secondary | ICD-10-CM

## 2020-07-05 DIAGNOSIS — R7303 Prediabetes: Secondary | ICD-10-CM

## 2020-07-05 DIAGNOSIS — E785 Hyperlipidemia, unspecified: Secondary | ICD-10-CM

## 2020-07-05 DIAGNOSIS — Z0000 Encounter for general adult medical examination without abnormal findings: Secondary | ICD-10-CM | POA: Diagnosis not present

## 2020-07-05 DIAGNOSIS — E2839 Other primary ovarian failure: Secondary | ICD-10-CM

## 2020-07-05 NOTE — Patient Instructions (Addendum)
Start exercising. You should be getting 150 minutes of moderate intensity exercise weekly.  Continue to work on a healthy diet. Ensure you are consuming 64 ounces of water daily.  Call the [facility] to schedule your mammogram and bone density scan.   Complete the Cologuard Kit once received.   Monitor your blood pressure at work, notify me if you see readings that are consistently at or above 135/90.   Please call me with the dates of your pneumonia and influenza vaccine.  It was a pleasure to see you today!   Preventive Care 17 Years and Older, Female Preventive care refers to lifestyle choices and visits with your health care provider that can promote health and wellness. This includes:  A yearly physical exam. This is also called an annual well check.  Regular dental and eye exams.  Immunizations.  Screening for certain conditions.  Healthy lifestyle choices, such as diet and exercise. What can I expect for my preventive care visit? Physical exam Your health care provider will check:  Height and weight. These may be used to calculate body mass index (BMI), which is a measurement that tells if you are at a healthy weight.  Heart rate and blood pressure.  Your skin for abnormal spots. Counseling Your health care provider may ask you questions about:  Alcohol, tobacco, and drug use.  Emotional well-being.  Home and relationship well-being.  Sexual activity.  Eating habits.  History of falls.  Memory and ability to understand (cognition).  Work and work Statistician.  Pregnancy and menstrual history. What immunizations do I need?  Influenza (flu) vaccine  This is recommended every year. Tetanus, diphtheria, and pertussis (Tdap) vaccine  You may need a Td booster every 10 years. Varicella (chickenpox) vaccine  You may need this vaccine if you have not already been vaccinated. Zoster (shingles) vaccine  You may need this after age  27. Pneumococcal conjugate (PCV13) vaccine  One dose is recommended after age 21. Pneumococcal polysaccharide (PPSV23) vaccine  One dose is recommended after age 35. Measles, mumps, and rubella (MMR) vaccine  You may need at least one dose of MMR if you were born in 1957 or later. You may also need a second dose. Meningococcal conjugate (MenACWY) vaccine  You may need this if you have certain conditions. Hepatitis A vaccine  You may need this if you have certain conditions or if you travel or work in places where you may be exposed to hepatitis A. Hepatitis B vaccine  You may need this if you have certain conditions or if you travel or work in places where you may be exposed to hepatitis B. Haemophilus influenzae type b (Hib) vaccine  You may need this if you have certain conditions. You may receive vaccines as individual doses or as more than one vaccine together in one shot (combination vaccines). Talk with your health care provider about the risks and benefits of combination vaccines. What tests do I need? Blood tests  Lipid and cholesterol levels. These may be checked every 5 years, or more frequently depending on your overall health.  Hepatitis C test.  Hepatitis B test. Screening  Lung cancer screening. You may have this screening every year starting at age 59 if you have a 30-pack-year history of smoking and currently smoke or have quit within the past 15 years.  Colorectal cancer screening. All adults should have this screening starting at age 19 and continuing until age 65. Your health care provider may recommend screening at  age 25 if you are at increased risk. You will have tests every 1-10 years, depending on your results and the type of screening test.  Diabetes screening. This is done by checking your blood sugar (glucose) after you have not eaten for a while (fasting). You may have this done every 1-3 years.  Mammogram. This may be done every 1-2 years. Talk with  your health care provider about how often you should have regular mammograms.  BRCA-related cancer screening. This may be done if you have a family history of breast, ovarian, tubal, or peritoneal cancers. Other tests  Sexually transmitted disease (STD) testing.  Bone density scan. This is done to screen for osteoporosis. You may have this done starting at age 71. Follow these instructions at home: Eating and drinking  Eat a diet that includes fresh fruits and vegetables, whole grains, lean protein, and low-fat dairy products. Limit your intake of foods with high amounts of sugar, saturated fats, and salt.  Take vitamin and mineral supplements as recommended by your health care provider.  Do not drink alcohol if your health care provider tells you not to drink.  If you drink alcohol: ? Limit how much you have to 0-1 drink a day. ? Be aware of how much alcohol is in your drink. In the [place] one drink equals one 12 oz bottle of beer (355 mL), one 5 oz glass of wine (148 mL), or one 1 oz glass of hard liquor (44 mL). Lifestyle  Take daily care of your teeth and gums.  Stay active. Exercise for at least 30 minutes on 5 or more days each week.  Do not use any products that contain nicotine or tobacco, such as cigarettes, e-cigarettes, and chewing tobacco. If you need help quitting, ask your health care provider.  If you are sexually active, practice safe sex. Use a condom or other form of protection in order to prevent STIs (sexually transmitted infections).  Talk with your health care provider about taking a low-dose aspirin or statin. What's next?  Go to your health care provider once a year for a well check visit.  Ask your health care provider how often you should have your eyes and teeth checked.  Stay up to date on all vaccines. This information is not intended to replace advice given to you by your health care provider. Make sure you discuss any questions you have with your  health care provider. Document Revised: 10/15/2018 Document Reviewed: 10/15/2018 Elsevier Patient Education  2020 [employer].

## 2020-07-05 NOTE — Assessment & Plan Note (Signed)
LDL of 147, she does have a FH of CVA in her mother.  Discussed the need to work on weight loss through diet and exercise, especially given her elevated BP reading today and her diabetes. She kindly declines any treatment for cholesterol.

## 2020-07-05 NOTE — Progress Notes (Signed)
Subjective:    Patient ID: Erika Gonzalez, female    DOB: 24-Dec-1955, 64 y.o.   MRN: [id number]  HPI  This visit occurred during the SARS-CoV-2 public health emergency.  Safety protocols were in place, including screening questions prior to the visit, additional usage of staff PPE, and extensive cleaning of exam room while observing appropriate contact time as indicated for disinfecting solutions.   Erika Gonzalez is a 64 year old female who presents today for complete physical.  Immunizations: -Tetanus: Completed in 2010? -Influenza: Due this season  -Shingles: Declines  -Pneumonia: She will get at work -Covid-19: She will get at work  Diet: She endorses a healthy diet. Exercise: She is not exercising much.  Eye exam: Completed 2 years ago, she is scheduled.  Dental exam: No recent exam, she is scheduled   Mammogram: Completed in 2018 Dexa: Due Colonoscopy: She never received the Cologuard, she agrees this time. Hep C Screen: Negative  BP Readings from Last 3 Encounters:  07/05/20 (!) 144/98  08/11/17 124/82  07/04/16 124/84   She is is not checking her BP at home.   Wt Readings from Last 3 Encounters:  07/05/20 171 lb (77.6 kg)  08/11/17 170 lb 1.9 oz (77.2 kg)  07/04/16 172 lb 6.4 oz (78.2 kg)     Review of Systems  Constitutional: Negative for unexpected weight change.  HENT: Negative for rhinorrhea.   Respiratory: Negative for cough and shortness of breath.   Cardiovascular: Negative for chest pain.  Gastrointestinal: Negative for constipation and diarrhea.  Genitourinary: Negative for difficulty urinating.  Musculoskeletal: Negative for arthralgias and myalgias.  Skin: Negative for rash.  Allergic/Immunologic: Negative for environmental allergies.  Neurological: Negative for dizziness, numbness and headaches.  Psychiatric/Behavioral: The patient is not nervous/anxious.        Past Medical History:  Diagnosis Date  . Hyperlipidemia      Social History     Socioeconomic History  . Marital status: Married    Spouse name: Not on file  . Number of children: Not on file  . Years of education: Not on file  . Highest education level: Not on file  Occupational History  . Not on file  Tobacco Use  . Smoking status: Former Games developer  . Smokeless tobacco: Never Used  Substance and Sexual Activity  . Alcohol use: No    Alcohol/week: 0.0 standard drinks  . Drug use: No  . Sexual activity: Not on file  Other Topics Concern  . Not on file  Social History Narrative   Divorced.   2 children, 5 grandchildren.   Works as an Government social research officer.   Enjoys reading, walking, spending time with grandchildren.   Social Determinants of Health   Financial Resource Strain:   . Difficulty of Paying Living Expenses: Not on file  Food Insecurity:   . Worried About Programme researcher, broadcasting/film/video in the Last Year: Not on file  . Ran Out of Food in the Last Year: Not on file  Transportation Needs:   . Lack of Transportation (Medical): Not on file  . Lack of Transportation (Non-Medical): Not on file  Physical Activity:   . Days of Exercise per Week: Not on file  . Minutes of Exercise per Session: Not on file  Stress:   . Feeling of Stress : Not on file  Social Connections:   . Frequency of Communication with Friends and Family: Not on file  . Frequency of Social Gatherings with Friends and Family: Not on  file  . Attends Religious Services: Not on file  . Active Member of Clubs or Organizations: Not on file  . Attends Banker Meetings: Not on file  . Marital Status: Not on file  Intimate Partner Violence:   . Fear of Current or Ex-Partner: Not on file  . Emotionally Abused: Not on file  . Physically Abused: Not on file  . Sexually Abused: Not on file    Past Surgical History:  Procedure Laterality Date  . INNER EAR SURGERY    . TUBAL LIGATION      Family History  Problem Relation Age of Onset  . Heart disease Mother   . Diabetes Mother   .  Lung cancer Father   . Diabetes Maternal Grandmother   . Breast cancer Neg Hx     No Known Allergies  Current Outpatient Medications on File Prior to Visit  Medication Sig Dispense Refill  . Multiple Vitamin (MULTIVITAMIN) tablet Take 1 tablet by mouth daily.     No current facility-administered medications on file prior to visit.    BP (!) 144/98   Pulse 78   Ht 5' 4.5" (1.638 m)   Wt 171 lb (77.6 kg)   SpO2 94%   BMI 28.90 kg/m    Objective:   Physical Exam HENT:     Right Ear: Tympanic membrane and ear canal normal.     Left Ear: Tympanic membrane and ear canal normal.  Eyes:     Pupils: Pupils are equal, round, and reactive to light.  Cardiovascular:     Rate and Rhythm: Normal rate and regular rhythm.  Pulmonary:     Effort: Pulmonary effort is normal.     Breath sounds: Normal breath sounds.  Abdominal:     General: Bowel sounds are normal.     Palpations: Abdomen is soft.     Tenderness: There is no abdominal tenderness.  Musculoskeletal:        General: Normal range of motion.     Cervical back: Neck supple.  Skin:    General: Skin is warm and dry.  Neurological:     Mental Status: She is alert and oriented to person, place, and time.     Cranial Nerves: No cranial nerve deficit.     Deep Tendon Reflexes:     Reflex Scores:      Patellar reflexes are 2+ on the right side and 2+ on the left side. Psychiatric:        Mood and Affect: Mood normal.            Assessment & Plan:

## 2020-07-05 NOTE — Assessment & Plan Note (Signed)
A1C today exactly the same as it was nearly three years ago. Discussed the importance of a healthy diet and regular exercise in order for weight loss, and to reduce the risk of any potential medical problems.  Continue to monitor.

## 2020-07-05 NOTE — Assessment & Plan Note (Addendum)
Prevnar 13 and Shingrix due, she will be getting Prevnar at work along with influenza vaccine and Covid-19 vaccines. I asked her to call us with those dates.   Mammogram overdue, ordered today. Bone density due, ordered today. Colon cancer screening overdue, she opts for Cologuard which will be sent to her home.   Discussed the importance of a healthy diet and regular exercise in order for weight loss, and to reduce the risk of any potential medical problems.  Exam today unremarkable Labs reviewed.

## 2020-08-01 ENCOUNTER — Other Ambulatory Visit: Payer: BC Managed Care – PPO

## 2020-08-17 DIAGNOSIS — Z1211 Encounter for screening for malignant neoplasm of colon: Secondary | ICD-10-CM | POA: Diagnosis not present

## 2020-08-17 LAB — COLOGUARD: Cologuard: POSITIVE — AB

## 2020-08-23 ENCOUNTER — Ambulatory Visit
Admission: RE | Admit: 2020-08-23 | Discharge: 2020-08-23 | Disposition: A | Discharge status: Home / Self Care | Payer: BC Managed Care – PPO | Source: Ambulatory Visit | Attending: Primary Care | Admitting: Primary Care

## 2020-08-23 ENCOUNTER — Other Ambulatory Visit: Payer: Self-pay

## 2020-08-23 DIAGNOSIS — Z1231 Encounter for screening mammogram for malignant neoplasm of breast: Secondary | ICD-10-CM | POA: Diagnosis not present

## 2020-08-23 DIAGNOSIS — E2839 Other primary ovarian failure: Secondary | ICD-10-CM | POA: Diagnosis not present

## 2020-08-23 DIAGNOSIS — Z780 Asymptomatic menopausal state: Secondary | ICD-10-CM | POA: Diagnosis not present

## 2020-08-23 DIAGNOSIS — M85851 Other specified disorders of bone density and structure, right thigh: Secondary | ICD-10-CM | POA: Diagnosis not present

## 2020-08-25 ENCOUNTER — Telehealth: Payer: Self-pay

## 2020-08-25 LAB — COLOGUARD: COLOGUARD: POSITIVE — AB

## 2020-08-25 NOTE — Telephone Encounter (Signed)
[facility] Night - Client Nonclinical Telephone Record AccessNurse Client [facility] Night - Client Client Site [facility] [place] - Night Physician [facility] - PHYSICIAN, Crissie Figures- MD Contact Type Call Who Is Calling Patient / Member / Family / Caregiver Caller Name Erika Gonzalez Caller Phone Number [phone] Call Type Message Only Information Provided Reason for Call Returning a Call from the Office Initial Comment Caller states, had a missed call from office. Disp. Time Disposition Final User 08/24/2020 5:11:16 PM General Information Provided Yes Merrily Pew Call Closed By: Merrily Pew Transaction Date/Time: 08/24/2020 5:10:01 PM (ET)

## 2020-08-25 NOTE — Telephone Encounter (Signed)
[facility] Night - Client Nonclinical Telephone Record AccessNurse Client [facility] Night - Client Client Site [facility] [place] - Night Physician Vernona Rieger - NP Contact Type Call Who Is Calling Patient / Member / Family / Caregiver Caller Name Erika Gonzalez Caller Phone Number [phone] Call Type Message Only Information Provided Reason for Call Returning a Call from the Office Initial Comment Caller states she is returning a call from the office. Additional Comment Office hours provided. Caller denied triage. Disp. Time Disposition Final User 08/24/2020 5:15:00 PM General Information Provided Yes Emeline Gins Call Closed By: Emeline Gins Transaction Date/Time: 08/24/2020 5:13:33 PM (ET)

## 2020-08-28 ENCOUNTER — Telehealth: Payer: Self-pay

## 2020-08-28 ENCOUNTER — Other Ambulatory Visit: Payer: Self-pay

## 2020-08-28 DIAGNOSIS — R195 Other fecal abnormalities: Secondary | ICD-10-CM

## 2020-08-28 NOTE — Progress Notes (Signed)
.  sap

## 2020-08-28 NOTE — Telephone Encounter (Signed)
Please notify patient of positive Cologuard result.  The Cologuard checks for evidence of DNA of colon cancer and/or detects presence of bleeding.  The next step would be for a colonoscopy, is she willing to have this done?

## 2020-08-28 NOTE — Telephone Encounter (Signed)
Received a Positive cologuard. Collected on 08/17/2020. Have not abstracted results. Have placed in your box for review.

## 2020-08-29 NOTE — Telephone Encounter (Signed)
Called patient reviewed all information and repeated back to me. Will call if any questions.  She has requested referral to GI at [facility]. Referral has been started patient will let us know if she does not get a call in the next week.

## 2020-08-29 NOTE — Telephone Encounter (Signed)
Erika Gonzalez with [employer] called to make sure that Mayra Reel NP received the abnormal cologuard results faxed over on 08/25/20. See previous messages.

## 2020-09-05 NOTE — Telephone Encounter (Signed)
See 10/20 result note bone density.

## 2020-12-12 ENCOUNTER — Telehealth: Payer: Self-pay | Admitting: Primary Care

## 2020-12-12 NOTE — Telephone Encounter (Signed)
Called [facility] GI office and they found referral in proficient but asked to re send notes and Cologuard report. This has been refaxed. I called and left message for patient to let her know about this and that their office will be calling her to schedule.

## 2020-12-12 NOTE — Telephone Encounter (Signed)
Pt called in wanted to know about the referral to a gastroenterologist and I located the referral and it was from 08/29/21-08/29/22 for Dr. Charlsie Merles , she called and they told her that they didn't have one on file.

## 2021-07-04 DIAGNOSIS — R195 Other fecal abnormalities: Secondary | ICD-10-CM | POA: Diagnosis not present

## 2021-07-07 ENCOUNTER — Emergency Department
Admission: EM | Admit: 2021-07-07 | Discharge: 2021-07-07 | Disposition: A | Discharge status: Home / Self Care | Payer: BC Managed Care – PPO | Attending: Emergency Medicine | Admitting: Emergency Medicine

## 2021-07-07 ENCOUNTER — Encounter: Payer: Self-pay | Admitting: Emergency Medicine

## 2021-07-07 ENCOUNTER — Other Ambulatory Visit: Payer: Self-pay

## 2021-07-07 ENCOUNTER — Emergency Department: Payer: BC Managed Care – PPO

## 2021-07-07 DIAGNOSIS — W230XXA Caught, crushed, jammed, or pinched between moving objects, initial encounter: Secondary | ICD-10-CM | POA: Insufficient documentation

## 2021-07-07 DIAGNOSIS — Z87891 Personal history of nicotine dependence: Secondary | ICD-10-CM | POA: Insufficient documentation

## 2021-07-07 DIAGNOSIS — S6992XA Unspecified injury of left wrist, hand and finger(s), initial encounter: Secondary | ICD-10-CM | POA: Diagnosis not present

## 2021-07-07 DIAGNOSIS — S6710XA Crushing injury of unspecified finger(s), initial encounter: Secondary | ICD-10-CM

## 2021-07-07 DIAGNOSIS — S61213A Laceration without foreign body of left middle finger without damage to nail, initial encounter: Secondary | ICD-10-CM | POA: Diagnosis not present

## 2021-07-07 DIAGNOSIS — S67193A Crushing injury of left middle finger, initial encounter: Secondary | ICD-10-CM | POA: Diagnosis not present

## 2021-07-07 NOTE — ED Triage Notes (Signed)
Pt reports that she shut her left middle finger in a car door this am. It is at the top joint of the her finger, still has some bleeding present. Her tetanus shot is up to date.

## 2021-07-07 NOTE — Discharge Instructions (Addendum)
Keep the wound clean, dry, and covered. Avoid lotions, oils, or creams to the wound glue.

## 2021-07-07 NOTE — ED Provider Notes (Signed)
[facility] Emergency Department Provider Note ____________________________________________  Time seen: 0825  I have reviewed the triage vital signs and the nursing notes.  HISTORY  Chief Complaint  Laceration   HPI MALAK DUCHESNEAU is a 65 y.o. female presents her self to the ED for evaluation of an injury to her left middle finger.  Patient reportedly slammed her finger in the car door accidentally this morning.  She presents with pain as well as some bleeding to the palmar aspect of the distal joint.  She denies any other injury at this time.  Past Medical History:  Diagnosis Date   Hyperlipidemia     Patient Active Problem List   Diagnosis Date Noted   Prediabetes 08/11/2017   Screening for cervical cancer 07/04/2016   Hyperlipidemia 07/04/2016   Preventative health care 05/09/2016    Past Surgical History:  Procedure Laterality Date   INNER EAR SURGERY     TUBAL LIGATION      Prior to Admission medications   Medication Sig Start Date End Date Taking? Authorizing Provider  Multiple Vitamin (MULTIVITAMIN) tablet Take 1 tablet by mouth daily.    [provider]    Allergies Patient has no known allergies.  Family History  Problem Relation Age of Onset   Heart disease Mother    Diabetes Mother    Lung cancer Father    Diabetes Maternal Grandmother    Breast cancer Neg Hx     Social History Social History   Tobacco Use   Smoking status: Former   Smokeless tobacco: Never  Substance Use Topics   Alcohol use: No    Alcohol/week: 0.0 standard drinks   Drug use: No    Review of Systems  Constitutional: Negative for fever. Cardiovascular: Negative for chest pain. Respiratory: Negative for shortness of breath. Gastrointestinal: Negative for abdominal pain, vomiting and diarrhea. Genitourinary: Negative for dysuria. Musculoskeletal: Negative for back pain.  Left middle finger injury as above. Skin: Negative for  rash. Neurological: Negative for headaches, focal weakness or numbness. ____________________________________________  PHYSICAL EXAM:  VITAL SIGNS: ED Triage Vitals  Enc Vitals Group     BP 07/07/21 0755 (!) 161/86     Pulse Rate 07/07/21 0755 94     Resp 07/07/21 0755 20     Temp 07/07/21 0755 98.3 F (36.8 C)     Temp Source 07/07/21 0755 Oral     SpO2 07/07/21 0755 100 %     Weight 07/07/21 0755 169 lb (76.7 kg)     Height 07/07/21 0755 5\' 3"  (1.6 m)     Head Circumference --      Peak Flow --      Pain Score 07/07/21 0801 4     Pain Loc --      Pain Edu? --      Excl. in GC? --     Constitutional: Alert and oriented. Well appearing and in no distress. Head: Normocephalic and atraumatic. Eyes: Conjunctivae are normal. Normal extraocular movements Cardiovascular: Normal rate, regular rhythm. Normal distal pulses. Respiratory: Normal respiratory effort. No wheezes/rales/rhonchi. Musculoskeletal: Left middle finger with a laceration across the palmar aspect of the IP.  Patient able demonstrate normal flexion extension range.  No flexion lag of the DIP.  No nail injuries appreciated.  Nontender with normal range of motion in all extremities.  Neurologic:  Normal gross sensation. Normal speech and language. No gross focal neurologic deficits are appreciated. Skin:  Skin is warm, dry and intact. No rash  noted. Psychiatric: Mood and affect are normal. Patient exhibits appropriate insight and judgment. ____________________________________________    {LABS (pertinent positives/negatives)  ____________________________________________  {EKG  ____________________________________________   RADIOLOGY Official radiology report(s): DG Finger Middle Left  Result Date: 07/07/2021 CLINICAL DATA:  Crush injury in car door. EXAM: LEFT MIDDLE FINGER 2+V COMPARISON:  None. FINDINGS: Tip of skin irregularity which could reflect a laceration. No acute fracture. Bony excrescence from the  dorsal tuft has a chronic appearance. Osteoarthritic spurring. No opaque foreign body. IMPRESSION: No acute fracture. Electronically Signed   By: Marnee Spring M.D.   On: 07/07/2021 08:47   ____________________________________________  PROCEDURES   .Marland KitchenLaceration Repair  Date/Time: 07/07/2021 8:55 AM Performed by: Karie Kirks, RN Authorized by: Lissa Hoard, PA-C   Consent:    Consent obtained:  Verbal   Consent given by:  Patient   Risks, benefits, and alternatives were discussed: yes     Risks discussed:  Poor wound healing   Alternatives discussed:  No treatment Universal protocol:    Procedure explained and questions answered to patient or proxy's satisfaction: yes     Imaging studies available: yes     Patient identity confirmed:  Verbally with patient Anesthesia:    Anesthesia method:  None Laceration details:    Location:  Finger   Finger location:  L long finger   Length (cm):  1   Depth (mm):  2 Pre-procedure details:    Preparation:  Patient was prepped and draped in usual sterile fashion Exploration:    Limited defect created (wound extended): no     Imaging obtained: x-ray     Contaminated: no   Treatment:    Area cleansed with:  Soap and water   Amount of cleaning:  Standard   Irrigation solution:  Tap water   Debridement:  None   Undermining:  None   Scar revision: no   Skin repair:    Repair method:  Tissue adhesive Approximation:    Approximation:  Close Repair type:    Repair type:  Simple Post-procedure details:    Dressing:  Non-adherent dressing and splint for protection   Procedure completion:  Tolerated well, no immediate complications ____________________________________________   INITIAL IMPRESSION / ASSESSMENT AND PLAN / ED COURSE  As part of my medical decision making, I reviewed the following data within the electronic MEDICAL RECORD NUMBER Radiograph reviewed WNL and Notes from prior ED visits     Patient ED evaluation  of an injury to the left middle finger after she closed in the car door.  She presents for evaluation of the complaint.  Patient with a superficial laceration to the palmar aspect of the DIP.  No x-ray evidence of any acute fracture or dislocation.  Exam is benign as it shows no flexor tendon lag.  Superficial wound is closed with tissue adhesive.  Patient discharged with wound care directions and supplies.   MESHELLE HOLNESS was evaluated in Emergency Department on 07/07/2021 for the symptoms described in the history of present illness. She was evaluated in the context of the global COVID-19 pandemic, which necessitated consideration that the patient might be at risk for infection with the SARS-CoV-2 virus that causes COVID-19. Institutional protocols and algorithms that pertain to the evaluation of patients at risk for COVID-19 are in a state of rapid change based on information released by regulatory bodies including the CDC and federal and state organizations. These policies and algorithms were followed during the patient's care in the  ED. ____________________________________________  FINAL CLINICAL IMPRESSION(S) / ED DIAGNOSES  Final diagnoses:  Crushing injury of finger of left hand  Laceration of left middle finger without foreign body without damage to nail, initial encounter      Lissa Hoard, PA-C 07/07/21 2751    Delton Prairie, MD 07/07/21 1309

## 2021-08-15 DIAGNOSIS — K621 Rectal polyp: Secondary | ICD-10-CM | POA: Diagnosis not present

## 2021-08-15 DIAGNOSIS — R195 Other fecal abnormalities: Secondary | ICD-10-CM | POA: Diagnosis not present

## 2021-08-15 DIAGNOSIS — D128 Benign neoplasm of rectum: Secondary | ICD-10-CM | POA: Diagnosis not present

## 2021-08-15 DIAGNOSIS — K635 Polyp of colon: Secondary | ICD-10-CM | POA: Diagnosis not present

## 2021-08-15 DIAGNOSIS — D124 Benign neoplasm of descending colon: Secondary | ICD-10-CM | POA: Diagnosis not present

## 2021-08-15 LAB — HM COLONOSCOPY

## 2021-09-03 ENCOUNTER — Encounter: Payer: Self-pay | Admitting: Primary Care

## 2022-03-07 ENCOUNTER — Telehealth: Payer: Self-pay | Admitting: Orthopaedic Surgery

## 2022-03-07 NOTE — Telephone Encounter (Signed)
Pt is calling wants to know if we can discuss the MRI over the phone --as her schedule is busy  ?

## 2022-03-27 ENCOUNTER — Ambulatory Visit (INDEPENDENT_AMBULATORY_CARE_PROVIDER_SITE_OTHER): Payer: BC Managed Care – PPO | Admitting: Primary Care

## 2022-03-27 ENCOUNTER — Encounter: Payer: Self-pay | Admitting: Primary Care

## 2022-03-27 VITALS — BP 126/80 | HR 97 | Temp 98.0°F | Ht 63.0 in | Wt 175.0 lb

## 2022-03-27 DIAGNOSIS — Z0000 Encounter for general adult medical examination without abnormal findings: Secondary | ICD-10-CM | POA: Diagnosis not present

## 2022-03-27 DIAGNOSIS — E785 Hyperlipidemia, unspecified: Secondary | ICD-10-CM | POA: Diagnosis not present

## 2022-03-27 DIAGNOSIS — R7303 Prediabetes: Secondary | ICD-10-CM

## 2022-03-27 DIAGNOSIS — Z1231 Encounter for screening mammogram for malignant neoplasm of breast: Secondary | ICD-10-CM

## 2022-03-27 LAB — LIPID PANEL
Cholesterol: 229 mg/dL — ABNORMAL HIGH (ref 0–200)
HDL: 56 mg/dL (ref >39.00)
LDL Cholesterol: 148 mg/dL — ABNORMAL HIGH (ref 0–99)
NonHDL: 172.56
Total CHOL/HDL Ratio: 4
Triglycerides: 125 mg/dL (ref 0.0–149.0)
VLDL: 25 mg/dL (ref 0.0–40.0)

## 2022-03-27 LAB — CBC
HCT: 39.5 % (ref 36.0–46.0)
Hemoglobin: 13.2 g/dL (ref 12.0–15.0)
MCHC: 33.6 g/dL (ref 30.0–36.0)
MCV: 85.4 fl (ref 78.0–100.0)
Platelets: 269 10*3/uL (ref 150.0–400.0)
RBC: 4.62 Mil/uL (ref 3.87–5.11)
RDW: 13.3 % (ref 11.5–15.5)
WBC: 6.1 10*3/uL (ref 4.0–10.5)

## 2022-03-27 LAB — COMPREHENSIVE METABOLIC PANEL
ALT: 26 U/L (ref 0–35)
AST: 24 U/L (ref 0–37)
Albumin: 4.3 g/dL (ref 3.5–5.2)
Alkaline Phosphatase: 83 U/L (ref 39–117)
BUN: 6 mg/dL (ref 6–23)
CO2: 30 mEq/L (ref 19–32)
Calcium: 9.5 mg/dL (ref 8.4–10.5)
Chloride: 103 mEq/L (ref 96–112)
Creatinine, Ser: 0.76 mg/dL (ref 0.40–1.20)
GFR: 81.03 mL/min (ref >60.00)
Glucose, Bld: 99 mg/dL (ref 70–99)
Potassium: 4.6 mEq/L (ref 3.5–5.1)
Sodium: 139 mEq/L (ref 135–145)
Total Bilirubin: 0.5 mg/dL (ref 0.2–1.2)
Total Protein: 7.2 g/dL (ref 6.0–8.3)

## 2022-03-27 LAB — HEMOGLOBIN A1C: Hgb A1c MFr Bld: 5.8 % (ref 4.6–6.5)

## 2022-03-27 NOTE — Assessment & Plan Note (Signed)
Shingrix and Pneumonia vaccines due, patient declines today. Mammogram due, orders placed. Bone density scan due, patient declines. Colonoscopy UTD, due 2027.  Marland KitchenDiscussed the importance of a healthy diet and regular exercise in order for weight loss, and to reduce the risk of further co-morbidity.  Exam today stable, labs pending

## 2022-03-27 NOTE — Patient Instructions (Signed)
Stop by the lab prior to leaving today. I will notify you of your results once received.   It was a pleasure to see you today!  Preventive Care 22 Years and Older, Female Preventive care refers to lifestyle choices and visits with your health care provider that can promote health and wellness. Preventive care visits are also called wellness exams. What can I expect for my preventive care visit? Counseling Your health care provider may ask you questions about your: Medical history, including: Past medical problems. Family medical history. Pregnancy and menstrual history. History of falls. Current health, including: Memory and ability to understand (cognition). Emotional well-being. Home life and relationship well-being. Sexual activity and sexual health. Lifestyle, including: Alcohol, nicotine or tobacco, and drug use. Access to firearms. Diet, exercise, and sleep habits. Work and work Statistician. Sunscreen use. Safety issues such as seatbelt and bike helmet use. Physical exam Your health care provider will check your: Height and weight. These may be used to calculate your BMI (body mass index). BMI is a measurement that tells if you are at a healthy weight. Waist circumference. This measures the distance around your waistline. This measurement also tells if you are at a healthy weight and may help predict your risk of certain diseases, such as type 2 diabetes and high blood pressure. Heart rate and blood pressure. Body temperature. Skin for abnormal spots. What immunizations do I need?  Vaccines are usually given at various ages, according to a schedule. Your health care provider will recommend vaccines for you based on your age, medical history, and lifestyle or other factors, such as travel or where you work. What tests do I need? Screening Your health care provider may recommend screening tests for certain conditions. This may include: Lipid and cholesterol  levels. Hepatitis C test. Hepatitis B test. HIV (human immunodeficiency virus) test. STI (sexually transmitted infection) testing, if you are at risk. Lung cancer screening. Colorectal cancer screening. Diabetes screening. This is done by checking your blood sugar (glucose) after you have not eaten for a while (fasting). Mammogram. Talk with your health care provider about how often you should have regular mammograms. BRCA-related cancer screening. This may be done if you have a family history of breast, ovarian, tubal, or peritoneal cancers. Bone density scan. This is done to screen for osteoporosis. Talk with your health care provider about your test results, treatment options, and if necessary, the need for more tests. Follow these instructions at home: Eating and drinking  Eat a diet that includes fresh fruits and vegetables, whole grains, lean protein, and low-fat dairy products. Limit your intake of foods with high amounts of sugar, saturated fats, and salt. Take vitamin and mineral supplements as recommended by your health care provider. Do not drink alcohol if your health care provider tells you not to drink. If you drink alcohol: Limit how much you have to 0-1 drink a day. Know how much alcohol is in your drink. In the [place] one drink equals one 12 oz bottle of beer (355 mL), one 5 oz glass of wine (148 mL), or one 1 oz glass of hard liquor (44 mL). Lifestyle Brush your teeth every morning and night with fluoride toothpaste. Floss one time each day. Exercise for at least 30 minutes 5 or more days each week. Do not use any products that contain nicotine or tobacco. These products include cigarettes, chewing tobacco, and vaping devices, such as e-cigarettes. If you need help quitting, ask your health care provider. Do not use  drugs. If you are sexually active, practice safe sex. Use a condom or other form of protection in order to prevent STIs. Take aspirin only as told by your  health care provider. Make sure that you understand how much to take and what form to take. Work with your health care provider to find out whether it is safe and beneficial for you to take aspirin daily. Ask your health care provider if you need to take a cholesterol-lowering medicine (statin). Find healthy ways to manage stress, such as: Meditation, yoga, or listening to music. Journaling. Talking to a trusted person. Spending time with friends and family. Minimize exposure to UV radiation to reduce your risk of skin cancer. Safety Always wear your seat belt while driving or riding in a vehicle. Do not drive: If you have been drinking alcohol. Do not ride with someone who has been drinking. When you are tired or distracted. While texting. If you have been using any mind-altering substances or drugs. Wear a helmet and other protective equipment during sports activities. If you have firearms in your house, make sure you follow all gun safety procedures. What's next? Visit your health care provider once a year for an annual wellness visit. Ask your health care provider how often you should have your eyes and teeth checked. Stay up to date on all vaccines. This information is not intended to replace advice given to you by your health care provider. Make sure you discuss any questions you have with your health care provider. Document Revised: 04/18/2021 Document Reviewed: 04/18/2021 Elsevier Patient Education  2023 Elsevier Inc.  

## 2022-03-27 NOTE — Progress Notes (Signed)
Subjective:    Patient ID: Erika Gonzalez, female    DOB: September 21, 1956, 66 y.o.   MRN: [id number]  HPI  Erika Gonzalez is a very pleasant 66 y.o. female who presents today for complete physical and follow up of chronic conditions.  Immunizations: -Tetanus: 2010 -Influenza: Completed last season  -Covid-19: 3 vaccines -Shingles: Never completed, declines  -Pneumonia: Never completed, declines   Diet: Fair diet.  Exercise: Regular exercise several days weekly on her treadmill, also active in the yard. Started exercising 1 month ago.   Eye exam: Completes annually  Dental exam: Completes semi-annually   Mammogram: Completed in 2021 Colonoscopy: Completed Cologuard in 2021, positive. Referred to GI in 2021, completed Colonoscopy in 2022, due again in 2027 Dexa: Completed in 2021, declines   BP Readings from Last 3 Encounters:  03/27/22 126/80  07/07/21 (!) 161/86  07/05/20 (!) 144/80     Review of Systems  Constitutional:  Negative for unexpected weight change.  HENT:  Negative for rhinorrhea.   Respiratory:  Negative for cough and shortness of breath.   Cardiovascular:  Negative for chest pain.  Gastrointestinal:  Negative for constipation and diarrhea.  Genitourinary:  Negative for difficulty urinating and menstrual problem.  Musculoskeletal:  Positive for arthralgias. Negative for myalgias.  Skin:  Negative for rash.  Allergic/Immunologic: Negative for environmental allergies.  Neurological:  Negative for dizziness and headaches.  Psychiatric/Behavioral:  The patient is not nervous/anxious.         Past Medical History:  Diagnosis Date   Hyperlipidemia     Social History   Socioeconomic History   Marital status: Single    Spouse name: Not on file   Number of children: Not on file   Years of education: Not on file   Highest education level: Not on file  Occupational History   Not on file  Tobacco Use   Smoking status: Former   Smokeless tobacco: Never   Substance and Sexual Activity   Alcohol use: No    Alcohol/week: 0.0 standard drinks   Drug use: No   Sexual activity: Not on file  Other Topics Concern   Not on file  Social History Narrative   Divorced.   2 children, 5 grandchildren.   Works as an Government social research officer.   Enjoys reading, walking, spending time with grandchildren.   Social Determinants of Health   Financial Resource Strain: Not on file  Food Insecurity: Not on file  Transportation Needs: Not on file  Physical Activity: Not on file  Stress: Not on file  Social Connections: Not on file  Intimate Partner Violence: Not on file    Past Surgical History:  Procedure Laterality Date   INNER EAR SURGERY     TUBAL LIGATION      Family History  Problem Relation Age of Onset   Heart disease Mother    Diabetes Mother    Lung cancer Father    Diabetes Maternal Grandmother    Breast cancer Neg Hx     No Known Allergies  Current Outpatient Medications on File Prior to Visit  Medication Sig Dispense Refill   Multiple Vitamin (MULTIVITAMIN) tablet Take 1 tablet by mouth daily.     No current facility-administered medications on file prior to visit.    BP 126/80   Pulse 97   Temp 98 F (36.7 C) (Oral)   Ht 5\' 3"  (1.6 m)   Wt 175 lb (79.4 kg)   SpO2 97%   BMI 31.00 kg/m  Objective:   Physical Exam HENT:     Right Ear: Tympanic membrane and ear canal normal.     Left Ear: Tympanic membrane and ear canal normal.     Nose: Nose normal.  Eyes:     Conjunctiva/sclera: Conjunctivae normal.     Pupils: Pupils are equal, round, and reactive to light.  Neck:     Thyroid: No thyromegaly.  Cardiovascular:     Rate and Rhythm: Normal rate and regular rhythm.     Heart sounds: No murmur heard. Pulmonary:     Effort: Pulmonary effort is normal.     Breath sounds: Normal breath sounds. No rales.  Abdominal:     General: Bowel sounds are normal.     Palpations: Abdomen is soft.     Tenderness: There is no  abdominal tenderness.  Musculoskeletal:        General: Normal range of motion.     Cervical back: Neck supple.  Lymphadenopathy:     Cervical: No cervical adenopathy.  Skin:    General: Skin is warm and dry.     Findings: No rash.  Neurological:     Mental Status: She is alert and oriented to person, place, and time.     Cranial Nerves: No cranial nerve deficit.     Deep Tendon Reflexes: Reflexes are normal and symmetric.  Psychiatric:        Mood and Affect: Mood normal.          Assessment & Plan:

## 2022-03-27 NOTE — Assessment & Plan Note (Signed)
Commended her on regular exercise, encouraged to continue!  Repeat lipid panel pending. Not on treatment.

## 2022-03-27 NOTE — Assessment & Plan Note (Signed)
Commended her on regular exercise, encouraged to continue.  Repeat A1C pending.

## 2022-04-04 ENCOUNTER — Telehealth: Payer: Self-pay

## 2022-04-04 NOTE — Telephone Encounter (Signed)
Patient returning phone call about lab results. 

## 2022-04-04 NOTE — Telephone Encounter (Signed)
See lab results for further documentation.  

## 2022-04-08 ENCOUNTER — Ambulatory Visit
Admission: RE | Admit: 2022-04-08 | Discharge: 2022-04-08 | Disposition: A | Discharge status: Home / Self Care | Payer: BC Managed Care – PPO | Source: Ambulatory Visit | Attending: Primary Care | Admitting: Primary Care

## 2022-04-08 DIAGNOSIS — Z1231 Encounter for screening mammogram for malignant neoplasm of breast: Secondary | ICD-10-CM | POA: Diagnosis not present

## 2022-04-09 ENCOUNTER — Other Ambulatory Visit: Payer: Self-pay | Admitting: Primary Care

## 2022-04-09 DIAGNOSIS — R928 Other abnormal and inconclusive findings on diagnostic imaging of breast: Secondary | ICD-10-CM

## 2022-04-09 DIAGNOSIS — N6489 Other specified disorders of breast: Secondary | ICD-10-CM

## 2022-04-10 ENCOUNTER — Telehealth: Payer: Self-pay | Admitting: Primary Care

## 2022-04-10 NOTE — Telephone Encounter (Signed)
Please kindly notify patient that I have yet to review her mammogram results but I will send her a message via MyChart as soon as I get a chance.

## 2022-04-10 NOTE — Telephone Encounter (Signed)
Pt is requesting a call back with mammogram results once reviewed at [phone]

## 2022-04-11 NOTE — Telephone Encounter (Signed)
Left message to return call to our office.  

## 2022-04-12 NOTE — Telephone Encounter (Signed)
Message has been sent to patient in my chart. Need to verify she has received.

## 2022-04-26 ENCOUNTER — Ambulatory Visit
Admission: RE | Admit: 2022-04-26 | Discharge: 2022-04-26 | Disposition: A | Discharge status: Home / Self Care | Payer: BC Managed Care – PPO | Source: Ambulatory Visit | Attending: Primary Care | Admitting: Primary Care

## 2022-04-26 DIAGNOSIS — N6489 Other specified disorders of breast: Secondary | ICD-10-CM

## 2022-04-26 DIAGNOSIS — R928 Other abnormal and inconclusive findings on diagnostic imaging of breast: Secondary | ICD-10-CM | POA: Insufficient documentation

## 2022-05-22 ENCOUNTER — Telehealth: Payer: Self-pay | Admitting: Primary Care

## 2022-05-22 NOTE — Telephone Encounter (Signed)
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

## 2022-05-23 NOTE — Telephone Encounter (Addendum)
I am just now seeing the form for the first time on 05/23/2022. Form completed and placed in Joellen's inbox.

## 2022-05-23 NOTE — Telephone Encounter (Signed)
Form placed in your box for review.  

## 2022-05-24 NOTE — Telephone Encounter (Signed)
Sent an message and call pt regarding this. This was placed up front for patient to pick up .

## 2022-10-27 DIAGNOSIS — R059 Cough, unspecified: Secondary | ICD-10-CM | POA: Diagnosis not present

## 2022-10-27 DIAGNOSIS — J069 Acute upper respiratory infection, unspecified: Secondary | ICD-10-CM | POA: Diagnosis not present

## 2022-10-27 DIAGNOSIS — R062 Wheezing: Secondary | ICD-10-CM | POA: Diagnosis not present

## 2023-03-03 ENCOUNTER — Telehealth: Payer: Self-pay | Admitting: Primary Care

## 2023-03-03 NOTE — Telephone Encounter (Signed)
  Nasal Congestion/Ear Pressure/Sinus Pressure: Try using Flonase (fluticasone) nasal spray. Instill 1 spray in each nostril twice daily. This can be purchased over the counter.  Runny Nose/Throat Drainage/Sneezing/Itchy or Watery Eyes: An antihistamine such as Zyrtec, Claritin, Xyzal, Allegra

## 2023-03-03 NOTE — Telephone Encounter (Signed)
Patient called in and was wanting to know what she could take from OTC that will help with her allergies. She stated that she has been dealing with watery eye, burning nose, and a little cough which is keeping her up at night. Please advise. Thank you!

## 2023-03-04 NOTE — Telephone Encounter (Signed)
Called and left message on voicemail per dpr, advised of Erika Gonzalez message. Advised to return call if any further questions.

## 2023-08-26 ENCOUNTER — Ambulatory Visit: Payer: Medicare Other | Admitting: Primary Care

## 2023-08-26 ENCOUNTER — Encounter: Payer: Self-pay | Admitting: Primary Care

## 2023-08-26 VITALS — BP 134/86 | HR 52 | Temp 98.2°F | Ht 63.0 in | Wt 171.0 lb

## 2023-08-26 DIAGNOSIS — Z23 Encounter for immunization: Secondary | ICD-10-CM | POA: Diagnosis not present

## 2023-08-26 DIAGNOSIS — Z87891 Personal history of nicotine dependence: Secondary | ICD-10-CM | POA: Diagnosis not present

## 2023-08-26 DIAGNOSIS — E785 Hyperlipidemia, unspecified: Secondary | ICD-10-CM | POA: Diagnosis not present

## 2023-08-26 DIAGNOSIS — Z0000 Encounter for general adult medical examination without abnormal findings: Secondary | ICD-10-CM | POA: Diagnosis not present

## 2023-08-26 DIAGNOSIS — Z1231 Encounter for screening mammogram for malignant neoplasm of breast: Secondary | ICD-10-CM

## 2023-08-26 DIAGNOSIS — Z111 Encounter for screening for respiratory tuberculosis: Secondary | ICD-10-CM

## 2023-08-26 DIAGNOSIS — R7303 Prediabetes: Secondary | ICD-10-CM | POA: Diagnosis not present

## 2023-08-26 LAB — COMPREHENSIVE METABOLIC PANEL
ALT: 24 U/L (ref 0–35)
AST: 21 U/L (ref 0–37)
Albumin: 4.5 g/dL (ref 3.5–5.2)
Alkaline Phosphatase: 83 U/L (ref 39–117)
BUN: 10 mg/dL (ref 6–23)
CO2: 30 meq/L (ref 19–32)
Calcium: 9.8 mg/dL (ref 8.4–10.5)
Chloride: 102 meq/L (ref 96–112)
Creatinine, Ser: 0.84 mg/dL (ref 0.40–1.20)
GFR: 71.15 mL/min (ref >60.00)
Glucose, Bld: 108 mg/dL — ABNORMAL HIGH (ref 70–99)
Potassium: 3.8 meq/L (ref 3.5–5.1)
Sodium: 141 meq/L (ref 135–145)
Total Bilirubin: 0.9 mg/dL (ref 0.2–1.2)
Total Protein: 7.2 g/dL (ref 6.0–8.3)

## 2023-08-26 LAB — LIPID PANEL
Cholesterol: 224 mg/dL — ABNORMAL HIGH (ref 0–200)
HDL: 52.1 mg/dL (ref >39.00)
LDL Cholesterol: 139 mg/dL — ABNORMAL HIGH (ref 0–99)
NonHDL: 172
Total CHOL/HDL Ratio: 4
Triglycerides: 167 mg/dL — ABNORMAL HIGH (ref 0.0–149.0)
VLDL: 33.4 mg/dL (ref 0.0–40.0)

## 2023-08-26 LAB — HEMOGLOBIN A1C: Hgb A1c MFr Bld: 5.9 % (ref 4.6–6.5)

## 2023-08-26 MED ORDER — ALBUTEROL SULFATE HFA 108 (90 BASE) MCG/ACT IN AERS: 2.0000 | INHALATION_SPRAY | RESPIRATORY_TRACT | 0 refills | Status: AC | PRN

## 2023-08-26 NOTE — Assessment & Plan Note (Signed)
Repeat A1C pending.  Discussed the importance of a healthy diet and regular exercise in order for weight loss, and to reduce the risk of further co-morbidity.  

## 2023-08-26 NOTE — Progress Notes (Signed)
Subjective:    Patient ID: Erika Gonzalez, female    DOB: Aug 05, 1956, 67 y.o.   MRN: [id number]  HPI  Erika Gonzalez is a very pleasant 67 y.o. female who presents today for complete physical and follow up of chronic conditions.  She would also like to discuss recurrent wheezing and shortness of breath. Symptoms initially began around Christmas 2023 with cough, wheezing, chest tightness. She was treated at [facility] urgent [facility] at the time, provided with prednisone, albuterol inhaler, and tessalon perles. Chest xray was negative for pneumonia. The inhaler was the only thing that helped. Same symptoms occurred in February 2024, used her albuterol inhaler with improvement. She is a prior smoker, smoked for about 20 in total, quit in 2011.  She is also needing a TB skin test and form completion.   Immunizations: -Tetanus: Completed in 2010, declines  -Influenza: Declines influenza vaccine.  -Shingles: Never completed, declines -Pneumonia: Never completed.  Diet: Fair diet.  Exercise: No regular exercise.  Eye exam: Completes annually  Dental exam: Completes semi-annually    Mammogram: Completed in June 2023, Bone Density Scan: Completed in 2021,  Colonoscopy: Completed in 2022, due 2027 Lung Cancer Screening: Quit smoking in 2011   BP Readings from Last 3 Encounters:  08/26/23 134/86  03/27/22 126/80  07/07/21 (!) 161/86      Review of Systems  Constitutional:  Negative for unexpected weight change.  HENT:  Negative for rhinorrhea.   Respiratory:  Negative for cough and shortness of breath.   Cardiovascular:  Negative for chest pain.  Gastrointestinal:  Negative for constipation and diarrhea.  Genitourinary:  Negative for difficulty urinating.  Musculoskeletal:  Negative for arthralgias and myalgias.  Skin:  Negative for rash.  Allergic/Immunologic: Negative for environmental allergies.  Neurological:  Negative for dizziness, numbness and headaches.   Psychiatric/Behavioral:  The patient is not nervous/anxious.          Past Medical History:  Diagnosis Date   Hyperlipidemia     Social History   Socioeconomic History   Marital status: Single    Spouse name: Not on file   Number of children: Not on file   Years of education: Not on file   Highest education level: Some college, no degree  Occupational History   Not on file  Tobacco Use   Smoking status: Former   Smokeless tobacco: Never  Substance and Sexual Activity   Alcohol use: No    Alcohol/week: 0.0 standard drinks of alcohol   Drug use: No   Sexual activity: Not on file  Other Topics Concern   Not on file  Social History Narrative   Divorced.   2 children, 5 grandchildren.   Works as an Government social research officer.   Enjoys reading, walking, spending time with grandchildren.   Social Determinants of Health   Financial Resource Strain: Low Risk  (08/24/2023)   Overall Financial Resource Strain (CARDIA)    Difficulty of Paying Living Expenses: Not hard at all  Food Insecurity: No Food Insecurity (08/24/2023)   Hunger Vital Sign    Worried About Running Out of Food in the Last Year: Never true    Ran Out of Food in the Last Year: Never true  Transportation Needs: No Transportation Needs (08/24/2023)   PRAPARE - Administrator, Civil Service (Medical): No    Lack of Transportation (Non-Medical): No  Physical Activity: Insufficiently Active (08/24/2023)   Exercise Vital Sign    Days of Exercise per Week: 3  days    Minutes of Exercise per Session: 40 min  Stress: No Stress Concern Present (08/24/2023)   [employer] of Occupational Health - Occupational Stress Questionnaire    Feeling of Stress : Not at all  Social Connections: Moderately Isolated (08/24/2023)   Social Connection and Isolation Panel [NHANES]    Frequency of Communication with Friends and Family: More than three times a week    Frequency of Social Gatherings with Friends and Family:  Three times a week    Attends Religious Services: More than 4 times per year    Active Member of Clubs or Organizations: No    Attends Engineer, structural: Not on file    Marital Status: Divorced  Catering manager Violence: Not on file    Past Surgical History:  Procedure Laterality Date   INNER EAR SURGERY     TUBAL LIGATION      Family History  Problem Relation Age of Onset   Heart disease Mother    Diabetes Mother    Lung cancer Father    Diabetes Maternal Grandmother    Breast cancer Neg Hx     No Known Allergies  Current Outpatient Medications on File Prior to Visit  Medication Sig Dispense Refill   Multiple Vitamin (MULTIVITAMIN) tablet Take 1 tablet by mouth daily.     No current facility-administered medications on file prior to visit.    BP 134/86   Pulse (!) 52   Temp 98.2 F (36.8 C) (Temporal)   Ht 5\' 3"  (1.6 m)   Wt 171 lb (77.6 kg)   SpO2 98%   BMI 30.29 kg/m  Objective:   Physical Exam HENT:     Right Ear: Tympanic membrane and ear canal normal.     Left Ear: Tympanic membrane and ear canal normal.  Eyes:     Pupils: Pupils are equal, round, and reactive to light.  Cardiovascular:     Rate and Rhythm: Normal rate and regular rhythm.  Pulmonary:     Effort: Pulmonary effort is normal.     Breath sounds: Normal breath sounds.  Abdominal:     General: Bowel sounds are normal.     Palpations: Abdomen is soft.     Tenderness: There is no abdominal tenderness.  Musculoskeletal:        General: Normal range of motion.     Cervical back: Neck supple.  Skin:    General: Skin is warm and dry.  Neurological:     Mental Status: She is alert and oriented to person, place, and time.     Cranial Nerves: No cranial nerve deficit.     Deep Tendon Reflexes:     Reflex Scores:      Patellar reflexes are 2+ on the right side and 2+ on the left side. Psychiatric:        Mood and Affect: Mood normal.           Assessment & Plan:   Preventative health care Assessment & Plan: Prevnar 20 provided today, she will get influenza vaccine at work.  Mammogram due, orders placed. Bone density scan due, declines  Colonoscopy UTD, due 2027  Discussed the importance of a healthy diet and regular exercise in order for weight loss, and to reduce the risk of further co-morbidity.  Exam stable. Labs pending.  Follow up in 1 year for repeat physical.    Hyperlipidemia, unspecified hyperlipidemia type Assessment & Plan: Repeat lipid panel pending.  Discussed the importance of  a healthy diet and regular exercise in order for weight loss, and to reduce the risk of further co-morbidity.   Orders: -     Lipid panel -     Comprehensive metabolic panel  Prediabetes Assessment & Plan: Repeat A1C pending.  Discussed the importance of a healthy diet and regular exercise in order for weight loss, and to reduce the risk of further co-morbidity.   Orders: -     Hemoglobin A1c  History of tobacco use -     Albuterol Sulfate HFA; Inhale 2 puffs into the lungs every 4 (four) hours as needed for shortness of breath.  Dispense: 1 each; Refill: 0  Screening mammogram for breast cancer -     3D Screening Mammogram, Left and Right; Future        Doreene Nest, NP

## 2023-08-26 NOTE — Progress Notes (Signed)
PPD Placement note Erika Gonzalez, 67 y.o. female is here today for placement of PPD test Reason for PPD test: Employement Pt taken PPD test before: yes Verified in allergy area and with patient that they are not allergic to the products PPD is made of (Phenol or Tween). Yes Is patient taking any oral or IV steroid medication now or have they taken it in the last month? no Has the patient ever received the BCG vaccine?: no Has the patient been in recent contact with anyone known or suspected of having active TB disease?: no      Date of exposure (if applicable): N/A      Name of person they were exposed to (if applicable): N/A Patient's Country of origin?: [place] O: Alert and oriented in NAD. P:  PPD placed on 08/26/2023.  Patient advised to return for reading within 48-72 hours.

## 2023-08-26 NOTE — Addendum Note (Signed)
Addended by: Lonia Blood on: 08/26/2023 08:28 AM   Modules accepted: Orders

## 2023-08-26 NOTE — Assessment & Plan Note (Signed)
Repeat lipid panel pending.  Discussed the importance of a healthy diet and regular exercise in order for weight loss, and to reduce the risk of further co-morbidity.  

## 2023-08-26 NOTE — Patient Instructions (Signed)
Stop by the lab prior to leaving today. I will notify you of your results once received.   [facility].   Return for the TB skin test read.  It was a pleasure to see you today!

## 2023-08-26 NOTE — Assessment & Plan Note (Signed)
Prevnar 20 provided today, she will get influenza vaccine at work.  Mammogram due, orders placed. Bone density scan due, declines  Colonoscopy UTD, due 2027  Discussed the importance of a healthy diet and regular exercise in order for weight loss, and to reduce the risk of further co-morbidity.  Exam stable. Labs pending.  Follow up in 1 year for repeat physical.

## 2023-08-29 ENCOUNTER — Ambulatory Visit (INDEPENDENT_AMBULATORY_CARE_PROVIDER_SITE_OTHER): Payer: Medicare Other

## 2023-08-29 DIAGNOSIS — Z111 Encounter for screening for respiratory tuberculosis: Secondary | ICD-10-CM

## 2023-08-29 LAB — TB SKIN TEST
Induration: NEGATIVE mm
TB Skin Test: NEGATIVE

## 2023-08-29 NOTE — Progress Notes (Signed)
 PPD Reading Note  PPD read and results entered in EpicCare.  Result: 0 mm induration.  Interpretation: Neg  If test not read within 48-72 hours of initial placement, patient advised to repeat in other arm 1-3 weeks after this test.  Allergic reaction: no

## 2023-09-29 ENCOUNTER — Ambulatory Visit
Admission: RE | Admit: 2023-09-29 | Discharge: 2023-09-29 | Disposition: A | Discharge status: Home / Self Care | Payer: Medicare Other | Source: Ambulatory Visit | Attending: Primary Care | Admitting: Primary Care

## 2023-09-29 DIAGNOSIS — Z1231 Encounter for screening mammogram for malignant neoplasm of breast: Secondary | ICD-10-CM | POA: Insufficient documentation

## 2023-09-30 ENCOUNTER — Other Ambulatory Visit: Payer: Self-pay | Admitting: Primary Care

## 2023-09-30 DIAGNOSIS — R928 Other abnormal and inconclusive findings on diagnostic imaging of breast: Secondary | ICD-10-CM

## 2023-10-07 ENCOUNTER — Ambulatory Visit
Admission: RE | Admit: 2023-10-07 | Discharge: 2023-10-07 | Disposition: A | Discharge status: Home / Self Care | Payer: Medicare Other | Source: Ambulatory Visit | Attending: Primary Care | Admitting: Primary Care

## 2023-10-07 DIAGNOSIS — R92322 Mammographic fibroglandular density, left breast: Secondary | ICD-10-CM | POA: Diagnosis not present

## 2023-10-07 DIAGNOSIS — R928 Other abnormal and inconclusive findings on diagnostic imaging of breast: Secondary | ICD-10-CM

## 2023-10-07 DIAGNOSIS — N6002 Solitary cyst of left breast: Secondary | ICD-10-CM | POA: Diagnosis not present

## 2023-10-07 DIAGNOSIS — N6320 Unspecified lump in the left breast, unspecified quadrant: Secondary | ICD-10-CM | POA: Diagnosis not present

## 2023-10-08 ENCOUNTER — Other Ambulatory Visit: Payer: Self-pay | Admitting: Primary Care

## 2023-10-08 DIAGNOSIS — R928 Other abnormal and inconclusive findings on diagnostic imaging of breast: Secondary | ICD-10-CM

## 2023-10-14 ENCOUNTER — Ambulatory Visit
Admission: RE | Admit: 2023-10-14 | Discharge: 2023-10-14 | Disposition: A | Discharge status: Home / Self Care | Payer: Medicare Other | Source: Ambulatory Visit | Attending: Primary Care | Admitting: Primary Care

## 2023-10-14 DIAGNOSIS — R928 Other abnormal and inconclusive findings on diagnostic imaging of breast: Secondary | ICD-10-CM | POA: Insufficient documentation

## 2023-10-14 DIAGNOSIS — N6322 Unspecified lump in the left breast, upper inner quadrant: Secondary | ICD-10-CM | POA: Diagnosis not present

## 2023-10-14 DIAGNOSIS — N6002 Solitary cyst of left breast: Secondary | ICD-10-CM | POA: Diagnosis not present

## 2023-10-14 HISTORY — PX: BREAST BIOPSY: SHX20

## 2023-10-14 MED ORDER — LIDOCAINE-EPINEPHRINE 1 %-1:100000 IJ SOLN
8.0000 mL | Freq: Once | INTRAMUSCULAR | Status: AC
  Administered 2023-10-14: 8 mL
  Filled 2023-10-14: qty 8

## 2023-10-14 MED ORDER — LIDOCAINE 1 % OPTIME INJ - NO CHARGE
2.0000 mL | Freq: Once | INTRAMUSCULAR | Status: AC
  Administered 2023-10-14: 2 mL
  Filled 2023-10-14: qty 2

## 2023-10-15 LAB — SURGICAL PATHOLOGY

## 2023-11-03 ENCOUNTER — Ambulatory Visit (INDEPENDENT_AMBULATORY_CARE_PROVIDER_SITE_OTHER): Payer: Medicare Other

## 2023-11-03 VITALS — Ht 63.0 in | Wt 171.0 lb

## 2023-11-03 DIAGNOSIS — Z0000 Encounter for general adult medical examination without abnormal findings: Secondary | ICD-10-CM | POA: Diagnosis not present

## 2023-11-03 NOTE — Patient Instructions (Signed)
Ms. Weinstock , Thank you for taking time to come for your Medicare Wellness Visit. I appreciate your ongoing commitment to your health goals. Please review the following plan we discussed and let me know if I can assist you in the future.   Referrals/Orders/Follow-Ups/Clinician Recommendations: none  This is a list of the screening recommended for you and due dates:  Health Maintenance  Topic Date Due   DTaP/Tdap/Td vaccine (2 - Tdap) 11/04/2018   Flu Shot  02/02/2024*   Medicare Annual Wellness Visit  11/02/2024   Mammogram  09/28/2025   Colon Cancer Screening  08/15/2026   DEXA scan (bone density measurement)  Completed   Hepatitis C Screening  Completed   HPV Vaccine  Aged Out   Pneumonia Vaccine  Discontinued   COVID-19 Vaccine  Discontinued   Cologuard (Stool DNA test)  Discontinued   Zoster (Shingles) Vaccine  Discontinued  *Topic was postponed. The date shown is not the original due date.    Advanced directives: (Copy Requested) Please bring a copy of your health care power of attorney and living will to the office to be added to your chart at your convenience.  Next Medicare Annual Wellness Visit scheduled for next year: Yes 11/03/2024 @ 10:10am televisit

## 2023-11-03 NOTE — Progress Notes (Signed)
Subjective:   Erika Gonzalez is a 67 y.o. female who presents for Medicare Annual (Subsequent) preventive examination.  Visit Complete: Virtual I connected with  Erika Gonzalez on 11/03/23 by a audio enabled telemedicine application and verified that I am speaking with the correct person using two identifiers.  Patient Location: Home  Provider Location: Office/Clinic  I discussed the limitations of evaluation and management by telemedicine. The patient expressed understanding and agreed to proceed.  Vital Signs: Because this visit was a virtual/telehealth visit, some criteria may be missing or patient reported. Any vitals not documented were not able to be obtained and vitals that have been documented are patient reported.  Patient Medicare AWV questionnaire was completed by the patient on 10/28/2023; I have confirmed that all information answered by patient is correct and no changes since this date.  Cardiac Risk Factors include: advanced age (>67men, >74 women);dyslipidemia;obesity (BMI >30kg/m2)    Objective:    Today's Vitals   11/03/23 0959  Weight: 171 lb (77.6 kg)  Height: 5\' 3"  (1.6 m)   Body mass index is 30.29 kg/m.     11/03/2023   10:07 AM 07/07/2021    8:02 AM  Advanced Directives  Does Patient Have a Medical Advance Directive? Yes No  Type of Estate agent of [place];Living will   Copy of Healthcare Power of Attorney in Chart? No - copy requested     Current Medications (verified) Outpatient Encounter Medications as of 11/03/2023  Medication Sig   albuterol (VENTOLIN HFA) 108 (90 Base) MCG/ACT inhaler Inhale 2 puffs into the lungs every 4 (four) hours as needed for shortness of breath.   Multiple Vitamin (MULTIVITAMIN) tablet Take 1 tablet by mouth daily.   No facility-administered encounter medications on file as of 11/03/2023.    Allergies (verified) Patient has no known allergies.   History: Past Medical History:  Diagnosis  Date   Hyperlipidemia    Past Surgical History:  Procedure Laterality Date   BREAST BIOPSY Left 10/14/2023   [place] Core bx ribbon clip - path pending   BREAST BIOPSY Left 10/14/2023   [place] LT BREAST BX W LOC DEV 1ST LESION IMG BX SPEC US GUIDE 10/14/2023 [facility]   INNER EAR SURGERY     TUBAL LIGATION     Family History  Problem Relation Age of Onset   Heart disease Mother    Diabetes Mother    Lung cancer Father    Diabetes Maternal Grandmother    Breast cancer Neg Hx    Social History   Socioeconomic History   Marital status: Single    Spouse name: Not on file   Number of children: Not on file   Years of education: Not on file   Highest education level: Some college, no degree  Occupational History   Not on file  Tobacco Use   Smoking status: Former   Smokeless tobacco: Never  Substance and Sexual Activity   Alcohol use: No    Alcohol/week: 0.0 standard drinks of alcohol   Drug use: No   Sexual activity: Not on file  Other Topics Concern   Not on file  Social History Narrative   Divorced.   2 children, 5 grandchildren.   Works as an Government social research officer.   Enjoys reading, walking, spending time with grandchildren.   Social Drivers of Corporate investment banker Strain: Low Risk  (11/03/2023)   Overall Financial Resource Strain (CARDIA)    Difficulty of Paying Living Expenses: Not  hard at all  Food Insecurity: No Food Insecurity (11/03/2023)   Hunger Vital Sign    Worried About Running Out of Food in the Last Year: Never true    Ran Out of Food in the Last Year: Never true  Transportation Needs: No Transportation Needs (11/03/2023)   PRAPARE - Administrator, Civil Service (Medical): No    Lack of Transportation (Non-Medical): No  Physical Activity: Insufficiently Active (11/03/2023)   Exercise Vital Sign    Days of Exercise per Week: 4 days    Minutes of Exercise per Session: 20 min  Stress: No Stress Concern Present (11/03/2023)   [employer] of Occupational Health - Occupational Stress Questionnaire    Feeling of Stress : Not at all  Social Connections: Moderately Isolated (11/03/2023)   Social Connection and Isolation Panel [NHANES]    Frequency of Communication with Friends and Family: More than three times a week    Frequency of Social Gatherings with Friends and Family: More than three times a week    Attends Religious Services: More than 4 times per year    Active Member of [employer] or Organizations: No    Attends Engineer, structural: Never    Marital Status: Divorced    Tobacco Counseling Counseling given: Not Answered  Clinical Intake:  Pre-visit preparation completed: No  Pain : No/denies pain    BMI - recorded: 30.29 Nutritional Status: BMI > 30  Obese Nutritional Risks: None Diabetes: No  How often do you need to have someone help you when you read instructions, pamphlets, or other written materials from your doctor or pharmacy?: 1 - Never  Interpreter Needed?: No  Comments: lives alone Information entered by :: Erika Cella,LPN   Activities of Daily Living    10/28/2023    4:27 PM  In your present state of health, do you have any difficulty performing the following activities:  Hearing? 0  Vision? 0  Difficulty concentrating or making decisions? 0  Walking or climbing stairs? 0  Dressing or bathing? 0  Doing errands, shopping? 0  Preparing Food and eating ? N  Using the Toilet? N  In the past six months, have you accidently leaked urine? N  Do you have problems with loss of bowel control? N  Managing your Medications? N  Managing your Finances? N  Housekeeping or managing your Housekeeping? N    Patient Care Team: Erika Nest, NP as PCP - [facility], [facility]  Indicate any recent Medical Services you may have received from other than [facility] providers in the past year (date may be approximate).     Assessment:   This is a routine  wellness examination for Erika Gonzalez.  Hearing/Vision screen Hearing Screening - Comments:: Pt says her hearing is good Vision Screening - Comments:: Pt says her vision is good wears glasses Dr Erika Gonzalez   Goals Addressed             This Visit's Progress    Patient Stated       I would like to lose weight: 15 pounds by healthy eating and walking       Depression Screen    11/03/2023   10:05 AM 08/26/2023    7:40 AM 03/27/2022    8:27 AM 07/05/2020    7:38 AM  PHQ 2/9 Scores  PHQ - 2 Score 0 0 0 0  PHQ- 9 Score   0 0    Fall Risk  11/03/2023   10:03 AM 10/28/2023    4:27 PM 08/26/2023    7:40 AM 03/27/2022    8:28 AM  Fall Risk   Falls in the past year? 0 0 0 0  Number falls in past yr: 0  0 0  Injury with Fall? 0  0 0  Risk for fall due to : No Fall Risks  No Fall Risks   Follow up Falls prevention discussed;Education provided  Falls evaluation completed     MEDICARE RISK AT HOME: Medicare Risk at Home Any stairs in or around the home?: (Patient-Rptd) Yes If so, are there any without handrails?: (Patient-Rptd) No Home free of loose throw rugs in walkways, pet beds, electrical cords, etc?: (Patient-Rptd) Yes Adequate lighting in your home to reduce risk of falls?: (Patient-Rptd) Yes Life alert?: (Patient-Rptd) No Use of a cane, walker or w/c?: (Patient-Rptd) No Grab bars in the bathroom?: (Patient-Rptd) No Shower chair or bench in shower?: (Patient-Rptd) No Elevated toilet seat or a handicapped toilet?: (Patient-Rptd) No  TIMED UP AND GO:  Was the test performed?  No    Cognitive Function:        11/03/2023   10:11 AM  6CIT Screen  What Year? 0 points  What month? 0 points  What time? 0 points  Count back from 20 0 points  Months in reverse 0 points  Repeat phrase 0 points  Total Score 0 points    Immunizations Immunization History  Administered Date(s) Administered   Moderna Sars-Covid-2 Vaccination 10/09/2020, 11/06/2020, 04/16/2021    PNEUMOCOCCAL CONJUGATE-20 08/26/2023   PPD Test 08/26/2023   Td 11/04/2008    TDAP status: Up to date  Flu Vaccine Status: pt says she took this vaccine at work  Pneumococcal vaccine status: Up to date  Covid-19 vaccine status: Completed vaccines  Qualifies for Shingles Vaccine? Yes   Zostavax completed No   Shingrix Completed?: No.    Education has been provided regarding the importance of this vaccine. Patient has been advised to call insurance company to determine out of pocket expense if they have not yet received this vaccine. Advised may also receive vaccine at local pharmacy or Health Dept. Verbalized acceptance and understanding.  Screening Tests Health Maintenance  Topic Date Due   INFLUENZA VACCINE  02/02/2024 (Originally 06/05/2023)   DTaP/Tdap/Td (2 - Tdap) 11/02/2024 (Originally 11/04/2018)   Medicare Annual Wellness (AWV)  11/02/2024   MAMMOGRAM  09/28/2025   Colonoscopy  08/15/2026   DEXA SCAN  Completed   Hepatitis C Screening  Completed   HPV VACCINES  Aged Out   Pneumonia Vaccine 22+ Years old  Discontinued   COVID-19 Vaccine  Discontinued   Fecal DNA (Cologuard)  Discontinued   Zoster Vaccines- Shingrix  Discontinued    Health Maintenance  There are no preventive care reminders to display for this patient.   Colorectal cancer screening: Type of screening: Colonoscopy. Completed 08/15/2021. Repeat every 5 years  Mammogram status: Completed 09/29/23. Repeat every year  Bone Density status: Completed 08/23/2020. Results reflect: Bone density results: NORMAL. Repeat every 5 years.  Lung Cancer Screening: (Low Dose CT Chest recommended if Age 62-80 years, 20 pack-year currently smoking OR have quit w/in 15years.) does not qualify.   Lung Cancer Screening Referral: no  Additional Screening:  Hepatitis C Screening: does not qualify; Completed 06/28/2020  Vision Screening: Recommended annual ophthalmology exams for early detection of glaucoma and other  disorders of the eye. Is the patient up to date with their annual eye exam?  No  Who is the provider or what is the name of the office in which the patient attends annual eye exams? Dr Erika Gonzalez If pt is not established with a provider, would they like to be referred to a provider to establish care? No .   Dental Screening: Recommended annual dental exams for proper oral hygiene  Diabetic Foot Exam:   Community Resource Referral / Chronic Care Management: CRR required this visit?  No   CCM required this visit?  No    Plan:     I have personally reviewed and noted the following in the patient's chart:   Medical and social history Use of alcohol, tobacco or illicit drugs  Current medications and supplements including opioid prescriptions. Patient is not currently taking opioid prescriptions. Functional ability and status Nutritional status Physical activity Advanced directives List of other physicians Hospitalizations, surgeries, and ER visits in previous 12 months Vitals Screenings to include cognitive, depression, and falls Referrals and appointments  In addition, I have reviewed and discussed with patient certain preventive protocols, quality metrics, and best practice recommendations. A written personalized care plan for preventive services as well as general preventive health recommendations were provided to patient.    Sue Lush, LPN   19/14/7829   After Visit Summary: (MyChart) Due to this being a telephonic visit, the after visit summary with patients personalized plan was offered to patient via MyChart   Nurse Notes: The patient states she is doing well and has no concerns or questions at this time.

## 2024-04-12 ENCOUNTER — Encounter: Payer: Self-pay | Admitting: Family Medicine

## 2024-04-12 ENCOUNTER — Ambulatory Visit (INDEPENDENT_AMBULATORY_CARE_PROVIDER_SITE_OTHER): Admitting: Family Medicine

## 2024-04-12 ENCOUNTER — Ambulatory Visit: Payer: Self-pay

## 2024-04-12 VITALS — BP 150/86 | HR 84 | Temp 97.5°F | Ht 63.0 in | Wt 173.5 lb

## 2024-04-12 DIAGNOSIS — H6123 Impacted cerumen, bilateral: Secondary | ICD-10-CM | POA: Diagnosis not present

## 2024-04-12 DIAGNOSIS — H9191 Unspecified hearing loss, right ear: Secondary | ICD-10-CM

## 2024-04-12 NOTE — Telephone Encounter (Signed)
 FYI Only or Action Required?: Action required by provider  Patient was last seen in primary care on 08/26/2023 by Gabriel John, NP. Called Nurse Triage reporting Ear Fullness. Symptoms began several weeks ago. Interventions attempted: OTC medications: ear drops. Symptoms are: unchanged.  Triage Disposition: See PCP When Office is Open (Within 3 Days)  Patient/caregiver understands and will follow disposition?: YesCopied from CRM [id number]. Topic: Clinical - Red Word Triage >> Apr 12, 2024  8:12 AM Marissa P wrote: Red Word that prompted transfer to Nurse Triage: Patient called stated that she has a impacted ear on right side, was taking some drops and it wont go inside. No pain but when she talks and someone right there she can't here when having a conversation can't hear anything. Been going on for about 2 weeks. Reason for Disposition  Ear congestion present > 48 hours  Answer Assessment - Initial Assessment Questions 1. LOCATION: "Which ear is involved?"       Right ear 2. SENSATION: "Describe how the ear feels." (e.g. stuffy, full, plugged)."      Can't hear/feels full  3. ONSET:  "When did the ear symptoms start?"       10-14 days ago  4. PAIN: "Do you also have an earache?" If Yes, ask: "How bad is it?" (Scale 1-10; or mild, moderate, severe)     Denies  5. CAUSE: "What do you think is causing the ear congestion?"     Wax buildup 6. URI: "Do you have a runny nose or cough?"      Denies  7. NASAL ALLERGIES: "Are there symptoms of hay fever, such as sneezing or a clear nasal discharge?"     denies   Pt has been using OTC drops but drops wont go in canal for last 5 days. PT states she can't hear out of ear.  Protocols used: Ear - Congestion-A-AH

## 2024-04-12 NOTE — Progress Notes (Signed)
     Erika Poche T. Janah Mcculloh, MD, CAQ Sports Medicine [facility] at [facility] [address] [place], [address]  Phone: [phone]  FAX: [phone]  Erika Gonzalez - 68 y.o. female  MRN [id number]  Date of Birth: 29-Jun-1956  Date: 04/12/2024  PCP: Gabriel John, NP  Referral: Gabriel John, NP  Chief Complaint  Patient presents with   Ear Fullness    Unable to hear out of right ear    Subjective:   Erika Gonzalez is a 68 y.o. very pleasant female patient with Body mass index is 30.73 kg/m. who presents with the following:  Cannot hear out of the R ear  Denies any systemic symptoms including URI symptoms, runny nose, cough, sore throat, sinus pain, nausea, vomiting, diarrhea.  She has had a history of ceruminosis in the past that required removal, but this was multiple years ago.  Review of Systems is noted in the HPI, as appropriate  Objective:   BP (!) 150/86   Pulse 84   Temp (!) 97.5 F (36.4 C) (Temporal)   Ht 5\' 3"  (1.6 m)   Wt 173 lb 8 oz (78.7 kg)   SpO2 96%   BMI 30.73 kg/m   GEN: No acute distress; alert,appropriate. ENT: Bilateral cerumen impaction, worse on the right PULM: Breathing comfortably in no respiratory distress PSYCH: Normally interactive.   Laboratory and Imaging Data:  Assessment and Plan:     ICD-10-CM   1. Acute hearing loss, right  H91.91     2. Bilateral impacted cerumen  H61.23      Cerumen was removed with elephant ear wash, and she had complete recovery of hearing on the right side after this was finished.  Disposition: prn  [employer] One speech-to-text software was used for transcription in this dictation.  Possible transcriptional errors can occur using Animal nutritionist.   Signed,  Ranny Bye. Maleak Brazzel, MD   Outpatient Encounter Medications as of 04/12/2024  Medication Sig   albuterol  (VENTOLIN  HFA) 108 (90 Base) MCG/ACT inhaler Inhale 2 puffs into the lungs every 4 (four) hours  as needed for shortness of breath.   Multiple Vitamin (MULTIVITAMIN) tablet Take 1 tablet by mouth daily.   No facility-administered encounter medications on file as of 04/12/2024.

## 2024-04-15 ENCOUNTER — Other Ambulatory Visit: Payer: Self-pay | Admitting: Primary Care

## 2024-04-15 DIAGNOSIS — N6489 Other specified disorders of breast: Secondary | ICD-10-CM

## 2024-04-20 ENCOUNTER — Other Ambulatory Visit: Payer: Self-pay | Admitting: Primary Care

## 2024-04-20 DIAGNOSIS — N6489 Other specified disorders of breast: Secondary | ICD-10-CM

## 2024-04-28 ENCOUNTER — Ambulatory Visit
Admission: RE | Admit: 2024-04-28 | Discharge: 2024-04-28 | Disposition: A | Discharge status: Home / Self Care | Source: Ambulatory Visit | Attending: Primary Care | Admitting: Primary Care

## 2024-04-28 DIAGNOSIS — N6489 Other specified disorders of breast: Secondary | ICD-10-CM | POA: Diagnosis not present

## 2024-04-28 DIAGNOSIS — R92322 Mammographic fibroglandular density, left breast: Secondary | ICD-10-CM | POA: Diagnosis not present

## 2024-04-28 DIAGNOSIS — N6002 Solitary cyst of left breast: Secondary | ICD-10-CM | POA: Diagnosis not present

## 2024-10-06 ENCOUNTER — Other Ambulatory Visit: Payer: Self-pay | Admitting: Primary Care

## 2024-10-06 DIAGNOSIS — N6489 Other specified disorders of breast: Secondary | ICD-10-CM

## 2024-11-03 ENCOUNTER — Ambulatory Visit (INDEPENDENT_AMBULATORY_CARE_PROVIDER_SITE_OTHER): Payer: Medicare Other

## 2024-11-03 VITALS — Ht 63.0 in | Wt 173.0 lb

## 2024-11-03 DIAGNOSIS — Z0000 Encounter for general adult medical examination without abnormal findings: Secondary | ICD-10-CM | POA: Diagnosis not present

## 2024-11-03 NOTE — Patient Instructions (Signed)
 Ms. Hottel,  Thank you for taking the time for your Medicare Wellness Visit. I appreciate your continued commitment to your health goals. Please review the care plan we discussed, and feel free to reach out if I can assist you further.  Please note that Annual Wellness Visits do not include a physical exam. Some assessments may be limited, especially if the visit was conducted virtually. If needed, we may recommend an in-person follow-up with your provider.  Ongoing Care Seeing your primary care provider every 3 to 6 months helps us  monitor your health and provide consistent, personalized care.   Referrals If a referral was made during today's visit and you haven't received any updates within two weeks, please contact the referred provider directly to check on the status. Advance directive information may be found on [email or web address] under Advance Care directives or Living will.   Recommended Screenings:  Health Maintenance  Topic Date Due   DTaP/Tdap/Td vaccine (2 - Tdap) 11/04/2018   Flu Shot  Never done   Medicare Annual Wellness Visit  11/02/2024   Breast Cancer Screening  09/28/2025   Colon Cancer Screening  08/15/2026   Osteoporosis screening with Bone Density Scan  Completed   Hepatitis C Screening  Completed   Meningitis B Vaccine  Aged Out   Pneumococcal Vaccine for age over 66  Discontinued   COVID-19 Vaccine  Discontinued   Cologuard (Stool DNA test)  Discontinued   Zoster (Shingles) Vaccine  Discontinued       11/02/2024    6:25 PM  Advanced Directives  Does Patient Have a Medical Advance Directive? No  Would patient like information on creating a medical advance directive? Yes (MAU/Ambulatory/Procedural Areas - Information given)    Vision: Annual vision screenings are recommended for early detection of glaucoma, cataracts, and diabetic retinopathy. These exams can also reveal signs of chronic conditions such as diabetes and high blood pressure.  Dental: Annual dental  screenings help detect early signs of oral cancer, gum disease, and other conditions linked to overall health, including heart disease and diabetes.

## 2024-11-03 NOTE — Progress Notes (Signed)
 "  Chief Complaint  Patient presents with   Medicare Wellness     Subjective:   Erika Gonzalez is a 68 y.o. female who presents for a Medicare Annual Wellness Visit.  Visit info / Clinical Intake: Medicare Wellness Visit Type:: Subsequent Annual Wellness Visit Persons participating in visit and providing information:: patient Medicare Wellness Visit Mode:: Telephone If telephone:: video declined Since this visit was completed virtually, some vitals may be partially provided or unavailable. Missing vitals are due to the limitations of the virtual format.: Unable to obtain vitals - no equipment If Telephone or Video please confirm:: I connected with patient using audio/video enable telemedicine. I verified patient identity with two identifiers, discussed telehealth limitations, and patient agreed to proceed. Patient Location:: home Provider Location:: office Interpreter Needed?: No Pre-visit prep was completed: yes AWV questionnaire completed by patient prior to visit?: yes Date:: 11/02/24 Living arrangements:: (!) (Patient-Rptd) lives alone Patient's Overall Health Status Rating: (Patient-Rptd) very good Typical amount of pain: (Patient-Rptd) none Does pain affect daily life?: (Patient-Rptd) no Are you currently prescribed opioids?: no  Dietary Habits and Nutritional Risks How many meals a day?: (Patient-Rptd) 3 Eats fruit and vegetables daily?: (Patient-Rptd) yes Most meals are obtained by: (Patient-Rptd) preparing own meals In the last 2 weeks, have you had any of the following?: none Diabetic:: no  Functional Status Activities of Daily Living (to include ambulation/medication): (Patient-Rptd) Independent Ambulation: (Patient-Rptd) Independent Medication Administration: (Patient-Rptd) Independent Home Management (perform basic housework or laundry): (Patient-Rptd) Independent Manage your own finances?: (Patient-Rptd) yes Primary transportation is: (Patient-Rptd)  driving Concerns about vision?: no *vision screening is required for WTM* Concerns about hearing?: no  Fall Screening Falls in the past year?: (Patient-Rptd) 0 Number of falls in past year: 0 Was there an injury with Fall?: 0 Fall Risk Category Calculator: 0 Patient Fall Risk Level: Low Fall Risk  Fall Risk Patient at Risk for Falls Due to: No Fall Risks Fall risk Follow up: Falls evaluation completed; Education provided; Falls prevention discussed  Home and Transportation Safety: All rugs have non-skid backing?: (Patient-Rptd) yes All stairs or steps have railings?: (Patient-Rptd) yes Grab bars in the bathtub or shower?: (Patient-Rptd) yes Have non-skid surface in bathtub or shower?: (Patient-Rptd) yes Good home lighting?: (Patient-Rptd) yes Regular seat belt use?: (Patient-Rptd) yes Hospital stays in the last year:: (Patient-Rptd) no  Cognitive Assessment Difficulty concentrating, remembering, or making decisions? : (Patient-Rptd) no Will 6CIT or Mini Cog be Completed: yes What year is it?: 0 points What month is it?: 0 points Give patient an address phrase to remember (5 components): [address] [place]  About what time is it?: 0 points Count backwards from 20 to 1: 0 points Say the months of the year in reverse: 0 points Repeat the address phrase from earlier: 0 points 6 CIT Score: 0 points  Advance Directives (For Healthcare) Does Patient Have a Medical Advance Directive?: No Would patient like information on creating a medical advance directive?: Yes (MAU/Ambulatory/Procedural Areas - Information given)  Reviewed/Updated  Reviewed/Updated: Reviewed All (Medical, Surgical, Family, Medications, Allergies, Care Teams, Patient Goals)    Allergies (verified) Patient has no known allergies.   Current Medications (verified) Outpatient Encounter Medications as of 11/03/2024  Medication Sig   albuterol  (VENTOLIN  HFA) 108 (90 Base) MCG/ACT inhaler Inhale 2  puffs into the lungs every 4 (four) hours as needed for shortness of breath.   Multiple Vitamin (MULTIVITAMIN) tablet Take 1 tablet by mouth daily.   No facility-administered encounter medications on file  as of 11/03/2024.    History: Past Medical History:  Diagnosis Date   Arthritis 2019   Fingers   Hyperlipidemia    Past Surgical History:  Procedure Laterality Date   BREAST BIOPSY Left 10/14/2023   Us  Core bx ribbon clip -STROMAL FIBROSIS WITH SINGLE BENIGN CYST. - ADENOSIS WITH CALCIFICATIONS. - FOCAL USUAL DUCTAL HYPERPLASIA. - NEGATIVE FOR ATYPIA OR MALIGNANCY.   BREAST BIOPSY Left 10/14/2023   US  LT BREAST BX W LOC DEV 1ST LESION IMG BX SPEC US  GUIDE 10/14/2023 [facility]   INNER EAR SURGERY     TUBAL LIGATION     Family History  Problem Relation Age of Onset   Heart disease Mother    Diabetes Mother    Lung cancer Father    Diabetes Maternal Grandmother    Breast cancer Neg Hx    Social History   Occupational History   Not on file  Tobacco Use   Smoking status: Former   Smokeless tobacco: Never  Substance and Sexual Activity   Alcohol use: No    Alcohol/week: 0.0 standard drinks of alcohol   Drug use: No   Sexual activity: Not on file   Tobacco Counseling Counseling given: Not Answered  SDOH Screenings   Food Insecurity: No Food Insecurity (11/02/2024)  Housing: Low Risk (11/02/2024)  Transportation Needs: No Transportation Needs (11/02/2024)  Utilities: Not At Risk (11/03/2024)  Alcohol Screen: Low Risk (11/03/2023)  Depression (PHQ2-9): Low Risk (11/03/2024)  Financial Resource Strain: Low Risk (11/02/2024)  Physical Activity: Insufficiently Active (11/02/2024)  Social Connections: Moderately Isolated (11/03/2024)  Stress: No Stress Concern Present (11/02/2024)  Tobacco Use: Medium Risk (11/03/2024)  Health Literacy: Adequate Health Literacy (11/03/2024)   See flowsheets for full screening details  Depression Screen PHQ 2 & 9 Depression  Scale- Over the past 2 weeks, how often have you been bothered by any of the following problems? Little interest or pleasure in doing things: 0 Feeling down, depressed, or hopeless (PHQ Adolescent also includes...irritable): 0 PHQ-2 Total Score: 0     Goals Addressed             This Visit's Progress    Patient Stated   Not on track    I would like to lose weight: 15 pounds by healthy eating and walking             Objective:    Today's Vitals   11/03/24 1031  Weight: 173 lb (78.5 kg)  Height: 5' 3 (1.6 m)   Body mass index is 30.65 kg/m.  Hearing/Vision screen Vision Screening - Comments:: UTD w/ visits to [facility] Immunizations and Health Maintenance Health Maintenance  Topic Date Due   DTaP/Tdap/Td (2 - Tdap) 11/04/2018   Influenza Vaccine  Never done   Medicare Annual Wellness (AWV)  11/02/2024   Mammogram  09/28/2025   Colonoscopy  08/15/2026   Bone Density Scan  Completed   Hepatitis C Screening  Completed   Meningococcal B Vaccine  Aged Out   Pneumococcal Vaccine: 50+ Years  Discontinued   COVID-19 Vaccine  Discontinued   Fecal DNA (Cologuard)  Discontinued   Zoster Vaccines- Shingrix  Discontinued        Assessment/Plan:  This is a routine wellness examination for Anisah.  Patient Care Team: Gretta Comer POUR, NP as PCP - [facility], [facility]  I have personally reviewed and noted the following in the patients chart:   Medical and social history Use of alcohol, tobacco or  illicit drugs  Current medications and supplements including opioid prescriptions. Functional ability and status Nutritional status Physical activity Advanced directives List of other physicians Hospitalizations, surgeries, and ER visits in previous 12 months Vitals Screenings to include cognitive, depression, and falls Referrals and appointments  No orders of the defined types were placed in this encounter.  In addition, I have  reviewed and discussed with patient certain preventive protocols, quality metrics, and best practice recommendations. A written personalized care plan for preventive services as well as general preventive health recommendations were provided to patient.   Erminio LITTIE Saris, LPN   [id number]   No follow-ups on file.  After Visit Summary: (MyChart) Due to this being a telephonic visit, the after visit summary with patients personalized plan was offered to patient via MyChart   Nurse Notes: No voiced or noted concerns at this time Patient advised to keep follow-up appointment with PCP (Jan 2026) Appointment(s) made: (Jan 2026) HM Addressed: Vaccines Due: Influenza declines  Pt has concerns about her insurance not wanting to pay for MMG every 6 mons. She has a clip in a place and needs Q63mon follow-ups. She wants to inquire with you "

## 2024-11-17 ENCOUNTER — Ambulatory Visit: Admitting: Primary Care

## 2024-11-17 ENCOUNTER — Encounter: Payer: Self-pay | Admitting: Primary Care

## 2024-11-17 ENCOUNTER — Ambulatory Visit: Payer: Self-pay | Admitting: Primary Care

## 2024-11-17 VITALS — BP 136/88 | HR 76 | Temp 98.4°F | Ht 64.75 in | Wt 176.6 lb

## 2024-11-17 DIAGNOSIS — R7303 Prediabetes: Secondary | ICD-10-CM | POA: Diagnosis not present

## 2024-11-17 DIAGNOSIS — Z0000 Encounter for general adult medical examination without abnormal findings: Secondary | ICD-10-CM | POA: Diagnosis not present

## 2024-11-17 DIAGNOSIS — E2839 Other primary ovarian failure: Secondary | ICD-10-CM

## 2024-11-17 DIAGNOSIS — Z23 Encounter for immunization: Secondary | ICD-10-CM | POA: Diagnosis not present

## 2024-11-17 DIAGNOSIS — E785 Hyperlipidemia, unspecified: Secondary | ICD-10-CM | POA: Diagnosis not present

## 2024-11-17 LAB — COMPREHENSIVE METABOLIC PANEL WITH GFR
ALT: 24 U/L (ref 3–35)
AST: 21 U/L (ref 5–37)
Albumin: 4.4 g/dL (ref 3.5–5.2)
Alkaline Phosphatase: 93 U/L (ref 39–117)
BUN: 10 mg/dL (ref 6–23)
CO2: 31 meq/L (ref 19–32)
Calcium: 9.5 mg/dL (ref 8.4–10.5)
Chloride: 105 meq/L (ref 96–112)
Creatinine, Ser: 0.76 mg/dL (ref 0.40–1.20)
GFR: 79.54 mL/min
Glucose, Bld: 95 mg/dL (ref 70–99)
Potassium: 4.6 meq/L (ref 3.5–5.1)
Sodium: 142 meq/L (ref 135–145)
Total Bilirubin: 0.5 mg/dL (ref 0.2–1.2)
Total Protein: 7 g/dL (ref 6.0–8.3)

## 2024-11-17 LAB — HEMOGLOBIN A1C: Hgb A1c MFr Bld: 5.9 % (ref 4.6–6.5)

## 2024-11-17 LAB — CBC
HCT: 39.8 % (ref 36.0–46.0)
Hemoglobin: 13.4 g/dL (ref 12.0–15.0)
MCHC: 33.8 g/dL (ref 30.0–36.0)
MCV: 85.4 fl (ref 78.0–100.0)
Platelets: 274 K/uL (ref 150.0–400.0)
RBC: 4.66 Mil/uL (ref 3.87–5.11)
RDW: 12.9 % (ref 11.5–15.5)
WBC: 7 K/uL (ref 4.0–10.5)

## 2024-11-17 LAB — LIPID PANEL
Cholesterol: 244 mg/dL — ABNORMAL HIGH (ref 28–200)
HDL: 52.9 mg/dL
LDL Cholesterol: 164 mg/dL — ABNORMAL HIGH (ref 10–99)
NonHDL: 191.06
Total CHOL/HDL Ratio: 5
Triglycerides: 133 mg/dL (ref 10.0–149.0)
VLDL: 26.6 mg/dL (ref 0.0–40.0)

## 2024-11-17 NOTE — Assessment & Plan Note (Signed)
 Repeat lipid panel pending.  Work on a healthy diet and regular exercise in order for weight loss, and to reduce the risk of further co-morbidity.

## 2024-11-17 NOTE — Progress Notes (Signed)
 "  Subjective:    Patient ID: Erika Gonzalez, female    DOB: 1956/07/09, 69 y.o.   MRN: [id number]  Erika Gonzalez is a very pleasant 69 y.o. female who presents today for complete physical and follow up of chronic conditions.  Immunizations: -Tetanus: Completed in 2010 -Influenza: Influenza vaccine provided today.   -Shingles: Never completed, declines -Pneumonia: Completed 2024   Diet: Fair diet.  Exercise: No regular exercise.  Eye exam: Completes annually  Dental exam: Completes semi-annually     Mammogram: Completed in June 2025 Bone Density Scan: Completed in 2021, declines   Colonoscopy: Completed in 2022, due 2027   BP Readings from Last 3 Encounters:  11/17/24 136/88  04/12/24 (!) 150/86  08/26/23 134/86   Wt Readings from Last 3 Encounters:  11/17/24 176 lb 9.6 oz (80.1 kg)  11/03/24 173 lb (78.5 kg)  04/12/24 173 lb 8 oz (78.7 kg)       Review of Systems  Constitutional:  Negative for unexpected weight change.  HENT:  Negative for rhinorrhea.   Respiratory:  Negative for cough and shortness of breath.   Cardiovascular:  Negative for chest pain.  Gastrointestinal:  Negative for constipation and diarrhea.  Genitourinary:  Negative for difficulty urinating and menstrual problem.  Musculoskeletal:  Negative for arthralgias and myalgias.  Skin:  Negative for rash.  Allergic/Immunologic: Negative for environmental allergies.  Neurological:  Negative for dizziness, numbness and headaches.  Psychiatric/Behavioral:  The patient is not nervous/anxious.          Past Medical History:  Diagnosis Date   Arthritis 2019   Fingers   Hyperlipidemia     Social History   Socioeconomic History   Marital status: Single    Spouse name: Not on file   Number of children: Not on file   Years of education: Not on file   Highest education level: Some college, no degree  Occupational History   Not on file  Tobacco Use   Smoking status: Former   Smokeless  tobacco: Never  Substance and Sexual Activity   Alcohol use: No    Alcohol/week: 0.0 standard drinks of alcohol   Drug use: No   Sexual activity: Not on file  Other Topics Concern   Not on file  Social History Narrative   Divorced.   2 children, 5 grandchildren.   Works as an government social research officer.   Enjoys reading, walking, spending time with grandchildren.   Social Drivers of Health   Tobacco Use: Medium Risk (11/17/2024)   Patient History    Smoking Tobacco Use: Former    Smokeless Tobacco Use: Never    Passive Exposure: Not on file  Financial Resource Strain: Low Risk (11/02/2024)   Overall Financial Resource Strain (CARDIA)    Difficulty of Paying Living Expenses: Not hard at all  Food Insecurity: No Food Insecurity (11/02/2024)   Epic    Worried About Programme Researcher, Broadcasting/film/video in the Last Year: Never true    Ran Out of Food in the Last Year: Never true  Transportation Needs: No Transportation Needs (11/02/2024)   Epic    Lack of Transportation (Medical): No    Lack of Transportation (Non-Medical): No  Physical Activity: Insufficiently Active (11/02/2024)   Exercise Vital Sign    Days of Exercise per Week: 2 days    Minutes of Exercise per Session: 20 min  Stress: No Stress Concern Present (11/02/2024)   [employer] of Occupational Health - Occupational Stress Questionnaire  Feeling of Stress: Not at all  Social Connections: Moderately Isolated (11/03/2024)   Social Connection and Isolation Panel    Frequency of Communication with Friends and Family: More than three times a week    Frequency of Social Gatherings with Friends and Family: Twice a week    Attends Religious Services: More than 4 times per year    Active Member of [employer] or Organizations: No    Attends Banker Meetings: Never    Marital Status: Divorced  Catering Manager Violence: Not At Risk (11/03/2024)   Epic    Fear of Current or Ex-Partner: No    Emotionally Abused: No    Physically  Abused: No    Sexually Abused: No  Depression (PHQ2-9): Low Risk (11/17/2024)   Depression (PHQ2-9)    PHQ-2 Score: 0  Alcohol Screen: Low Risk (11/03/2023)   Alcohol Screen    Last Alcohol Screening Score (AUDIT): 0  Housing: Low Risk (11/02/2024)   Epic    Unable to Pay for Housing in the Last Year: No    Number of Times Moved in the Last Year: 0    Homeless in the Last Year: No  Utilities: Not At Risk (11/03/2024)   Epic    Threatened with loss of utilities: No  Health Literacy: Adequate Health Literacy (11/03/2024)   B1300 Health Literacy    Frequency of need for help with medical instructions: Never    Past Surgical History:  Procedure Laterality Date   BREAST BIOPSY Left 10/14/2023   Us  Core bx ribbon clip -STROMAL FIBROSIS WITH SINGLE BENIGN CYST. - ADENOSIS WITH CALCIFICATIONS. - FOCAL USUAL DUCTAL HYPERPLASIA. - NEGATIVE FOR ATYPIA OR MALIGNANCY.   BREAST BIOPSY Left 10/14/2023   US  LT BREAST BX W LOC DEV 1ST LESION IMG BX SPEC US  GUIDE 10/14/2023 [facility]   INNER EAR SURGERY     TUBAL LIGATION      Family History  Problem Relation Age of Onset   Heart disease Mother    Diabetes Mother    Lung cancer Father    Diabetes Maternal Grandmother    Breast cancer Neg Hx     Allergies[1]  Medications Ordered Prior to Encounter[2]  BP 136/88 (BP Location: Right Arm, Patient Position: Sitting, Cuff Size: Large)   Pulse 76   Temp 98.4 F (36.9 C) (Temporal)   Ht 5' 4.75 (1.645 m)   Wt 176 lb 9.6 oz (80.1 kg)   SpO2 96%   BMI 29.62 kg/m  Objective:   Physical Exam HENT:     Right Ear: Tympanic membrane and ear canal normal.     Left Ear: Tympanic membrane and ear canal normal.  Eyes:     Pupils: Pupils are equal, round, and reactive to light.  Cardiovascular:     Rate and Rhythm: Normal rate and regular rhythm.  Pulmonary:     Effort: Pulmonary effort is normal.     Breath sounds: Normal breath sounds.  Abdominal:     General: Bowel sounds are  normal.     Palpations: Abdomen is soft.     Tenderness: There is no abdominal tenderness.  Musculoskeletal:        General: Normal range of motion.     Cervical back: Neck supple.  Skin:    General: Skin is warm and dry.  Neurological:     Mental Status: She is alert and oriented to person, place, and time.     Cranial Nerves: No cranial nerve deficit.  Deep Tendon Reflexes:     Reflex Scores:      Patellar reflexes are 2+ on the right side and 2+ on the left side. Psychiatric:        Mood and Affect: Mood normal.     Physical Exam        Assessment & Plan:  Preventative health care Assessment & Plan: Influenza vaccine provided today. Declined Shingrix vaccine. Mammogram UTD Bone density scan due, orders placed Colonoscopy UTD, due 2027  Discussed the importance of a healthy diet and regular exercise in order for weight loss, and to reduce the risk of further co-morbidity.  Exam stable. Labs pending.  Follow up in 1 year for repeat physical.    Need for influenza vaccination -     Flu vaccine HIGH DOSE PF(Fluzone Trivalent)  Estrogen deficiency -     DG Bone Density; Future  Hyperlipidemia, unspecified hyperlipidemia type Assessment & Plan: Repeat lipid panel pending.  Work on a healthy diet and regular exercise in order for weight loss, and to reduce the risk of further co-morbidity.   Orders: -     Lipid panel -     Comprehensive metabolic panel with GFR -     CBC  Prediabetes Assessment & Plan: Repeat A1C pending.  Work on a healthy diet and regular exercise in order for weight loss, and to reduce the risk of further co-morbidity.   Orders: -     Hemoglobin A1c -     CBC    Assessment and Plan Assessment & Plan         Erika MARLA Gaskins, NP       [1] No Known Allergies [2]  Current Outpatient Medications on File Prior to Visit  Medication Sig Dispense Refill   albuterol  (VENTOLIN  HFA) 108 (90 Base) MCG/ACT inhaler  Inhale 2 puffs into the lungs every 4 (four) hours as needed for shortness of breath. 1 each 0   Multiple Vitamin (MULTIVITAMIN) tablet Take 1 tablet by mouth daily.     No current facility-administered medications on file prior to visit.   "

## 2024-11-17 NOTE — Patient Instructions (Signed)
Stop by the lab prior to leaving today. I will notify you of your results once received.   [facility] to schedule your bone density scan.   It was a pleasure to see you today!

## 2024-11-17 NOTE — Assessment & Plan Note (Signed)
 Influenza vaccine provided today. Declined Shingrix vaccine. Mammogram UTD Bone density scan due, orders placed Colonoscopy UTD, due 2027  Discussed the importance of a healthy diet and regular exercise in order for weight loss, and to reduce the risk of further co-morbidity.  Exam stable. Labs pending.  Follow up in 1 year for repeat physical.

## 2024-11-17 NOTE — Assessment & Plan Note (Signed)
 Repeat A1C pending.  Work on a healthy diet and regular exercise in order for weight loss, and to reduce the risk of further co-morbidity.
